# Patient Record
Sex: Female | Born: 1965
Health system: Southern US, Community
[De-identification: ages and names within clinical notes are randomized; demographics above are authoritative.]

## PROBLEM LIST (undated history)

## (undated) DIAGNOSIS — O99345 Other mental disorders complicating the puerperium: Secondary | ICD-10-CM

## (undated) DIAGNOSIS — H269 Unspecified cataract: Secondary | ICD-10-CM

## (undated) DIAGNOSIS — F32A Depression, unspecified: Secondary | ICD-10-CM

## (undated) DIAGNOSIS — T7840XA Allergy, unspecified, initial encounter: Secondary | ICD-10-CM

## (undated) DIAGNOSIS — F53 Postpartum depression: Secondary | ICD-10-CM

## (undated) DIAGNOSIS — K219 Gastro-esophageal reflux disease without esophagitis: Secondary | ICD-10-CM

## (undated) DIAGNOSIS — F419 Anxiety disorder, unspecified: Secondary | ICD-10-CM

## (undated) DIAGNOSIS — T148XXA Other injury of unspecified body region, initial encounter: Principal | ICD-10-CM

## (undated) DIAGNOSIS — R011 Cardiac murmur, unspecified: Secondary | ICD-10-CM

## (undated) DIAGNOSIS — F329 Major depressive disorder, single episode, unspecified: Secondary | ICD-10-CM

## (undated) HISTORY — DX: Allergy, unspecified, initial encounter: T78.40XA

## (undated) HISTORY — DX: Anxiety disorder, unspecified: F41.9

## (undated) HISTORY — DX: Gastro-esophageal reflux disease without esophagitis: K21.9

## (undated) HISTORY — PX: EYE SURGERY: SHX253

## (undated) HISTORY — DX: Cardiac murmur, unspecified: R01.1

## (undated) HISTORY — PX: TONSILLECTOMY: SUR1361

## (undated) HISTORY — DX: Postpartum depression: F53.0

## (undated) HISTORY — DX: Other injury of unspecified body region, initial encounter: T14.8XXA

## (undated) HISTORY — DX: Other mental disorders complicating the puerperium: O99.345

## (undated) HISTORY — DX: Major depressive disorder, single episode, unspecified: F32.9

## (undated) HISTORY — DX: Unspecified cataract: H26.9

## (undated) HISTORY — PX: APPENDECTOMY: SHX54

## (undated) HISTORY — DX: Depression, unspecified: F32.A

---

## 1998-07-04 ENCOUNTER — Encounter: Payer: Self-pay | Admitting: Emergency Medicine

## 1998-07-04 ENCOUNTER — Emergency Department (HOSPITAL_COMMUNITY): Admission: EM | Admit: 1998-07-04 | Discharge: 1998-07-04 | Payer: Self-pay | Admitting: Emergency Medicine

## 1999-03-12 ENCOUNTER — Other Ambulatory Visit: Admission: RE | Admit: 1999-03-12 | Discharge: 1999-03-12 | Payer: Self-pay | Admitting: Obstetrics & Gynecology

## 2000-04-07 ENCOUNTER — Other Ambulatory Visit: Admission: RE | Admit: 2000-04-07 | Discharge: 2000-04-07 | Payer: Self-pay | Admitting: Obstetrics & Gynecology

## 2001-09-06 ENCOUNTER — Encounter: Admission: RE | Admit: 2001-09-06 | Discharge: 2001-09-06 | Payer: Self-pay | Admitting: Otolaryngology

## 2001-09-06 ENCOUNTER — Encounter: Payer: Self-pay | Admitting: Otolaryngology

## 2001-10-28 ENCOUNTER — Ambulatory Visit (HOSPITAL_BASED_OUTPATIENT_CLINIC_OR_DEPARTMENT_OTHER): Admission: RE | Admit: 2001-10-28 | Discharge: 2001-10-28 | Payer: Self-pay | Admitting: Otolaryngology

## 2001-10-28 ENCOUNTER — Encounter (INDEPENDENT_AMBULATORY_CARE_PROVIDER_SITE_OTHER): Payer: Self-pay | Admitting: *Deleted

## 2002-06-17 ENCOUNTER — Encounter: Admission: RE | Admit: 2002-06-17 | Discharge: 2002-06-17 | Payer: Self-pay | Admitting: Obstetrics and Gynecology

## 2002-06-17 ENCOUNTER — Encounter: Payer: Self-pay | Admitting: Obstetrics and Gynecology

## 2002-09-28 ENCOUNTER — Other Ambulatory Visit: Admission: RE | Admit: 2002-09-28 | Discharge: 2002-09-28 | Payer: Self-pay | Admitting: Obstetrics and Gynecology

## 2003-09-12 ENCOUNTER — Encounter: Admission: RE | Admit: 2003-09-12 | Discharge: 2003-09-12 | Payer: Self-pay | Admitting: Obstetrics and Gynecology

## 2003-11-29 ENCOUNTER — Other Ambulatory Visit: Admission: RE | Admit: 2003-11-29 | Discharge: 2003-11-29 | Payer: Self-pay | Admitting: Obstetrics and Gynecology

## 2005-01-22 ENCOUNTER — Other Ambulatory Visit: Admission: RE | Admit: 2005-01-22 | Discharge: 2005-01-22 | Payer: Self-pay | Admitting: Obstetrics and Gynecology

## 2005-02-18 ENCOUNTER — Emergency Department (HOSPITAL_COMMUNITY): Admission: EM | Admit: 2005-02-18 | Discharge: 2005-02-18 | Payer: Self-pay | Admitting: Emergency Medicine

## 2006-09-28 ENCOUNTER — Ambulatory Visit (HOSPITAL_COMMUNITY): Admission: RE | Admit: 2006-09-28 | Discharge: 2006-09-28 | Payer: Self-pay | Admitting: Obstetrics and Gynecology

## 2007-02-08 ENCOUNTER — Encounter: Admission: RE | Admit: 2007-02-08 | Discharge: 2007-02-08 | Payer: Self-pay | Admitting: Obstetrics and Gynecology

## 2009-11-12 ENCOUNTER — Emergency Department (HOSPITAL_COMMUNITY): Admission: EM | Admit: 2009-11-12 | Discharge: 2009-11-13 | Payer: Self-pay | Admitting: Emergency Medicine

## 2010-09-16 LAB — CBC
HCT: 40.7 % (ref 36.0–46.0)
Hemoglobin: 13.9 g/dL (ref 12.0–15.0)
MCHC: 34.2 g/dL (ref 30.0–36.0)
Platelets: 215 10*3/uL (ref 150–400)
RDW: 13.1 % (ref 11.5–15.5)

## 2010-09-16 LAB — BASIC METABOLIC PANEL
BUN: 9 mg/dL (ref 6–23)
CO2: 27 mEq/L (ref 19–32)
Glucose, Bld: 93 mg/dL (ref 70–99)
Potassium: 3.6 mEq/L (ref 3.5–5.1)
Sodium: 140 mEq/L (ref 135–145)

## 2010-09-16 LAB — DIFFERENTIAL
Basophils Absolute: 0 10*3/uL (ref 0.0–0.1)
Basophils Relative: 0 % (ref 0–1)
Eosinophils Absolute: 0.2 10*3/uL (ref 0.0–0.7)
Eosinophils Relative: 3 % (ref 0–5)
Monocytes Absolute: 0.8 10*3/uL (ref 0.1–1.0)

## 2010-09-16 LAB — HEPATIC FUNCTION PANEL
ALT: 15 U/L (ref 0–35)
AST: 21 U/L (ref 0–37)
Alkaline Phosphatase: 47 U/L (ref 39–117)
Bilirubin, Direct: <0.1 mg/dL (ref 0.0–0.3)
Total Bilirubin: 0.5 mg/dL (ref 0.3–1.2)

## 2010-11-15 NOTE — Op Note (Signed)
[name], Carol Martinez                ACCOUNT NO.:  [id number]   MEDICAL RECORD NO.:  [id number]          PATIENT TYPE:  AMB   LOCATION:  SDC                           FACILITY:  [facility]   PHYSICIAN:  Dineen Kid. Rana Snare, M.D.    DATE OF BIRTH:  1965/08/21   DATE OF PROCEDURE:  09/28/2006  DATE OF DISCHARGE:                               OPERATIVE REPORT   PREOPERATIVE DIAGNOSES:  1. Cervical stenosis.  2. Anxiety.  3. Desires intrauterine device insertion.   POSTOPERATIVE DIAGNOSES:  1. Cervical stenosis.  2. Anxiety.  3. Desires intrauterine device insertion.   PROCEDURES:  1. Dilation of cervix.  2. Mirena intrauterine device insertion.   SURGEON:  Dineen Kid. Rana Snare, M.D.   ANESTHESIA:  General by LMA.   INDICATIONS:  [name] is a 45 year old with a previous Mirena IUD and  an anxiety disorder.  She had reached the end of her previous IUD's  expected life, had it easily removed in the office, but because of  severe anxiety was unable to relax or allow for insertion of the new  IUD.  She does have amenorrhea due to the previous IUD and has developed  a cervical stenosis to point that it would require a small amount of  dilation to insert IUD.  Because of the above problems, the patient  desires insertion under anesthesia in the operating room.  The risks and  procedure of the procedure were discussed at length, including the  Mirena IUD's reported success rates and failure rates and complication  rates.  She does give her informed consent and wished to proceed.   DESCRIPTION OF PROCEDURE:  After adequate analgesia, the patient was  placed in the dorsal lithotomy position.  She was sterilely prepped and  draped, the bladder was sterilely drained.  A Graves speculum was  placed.  A tenaculum was placed on the anterior lip of the cervix.  A  paracervical block was placed of 1% Xylocaine with 1:100,000  epinephrine, a total of 20 mL used.  The uterus was sounded to 8 cm from  a  retroverted position.  It was dilated to a #17 Pratt dilator.  The  Mirena IUD was then inserted according to the manufacturer's  recommendations, the string cut to 1 inch in length, the tenaculum  removed and rendered hemostatic.  The speculum was removed.  The patient  transferred to the recovery room in stable condition.  Sponge and  instrument count was normal x3.  Estimated blood loss was minimal.   DISPOSITION:  The patient will be discharged home with follow-up in the  office in 6 weeks.  Told to return for increased pain, fever or  bleeding.      Dineen Kid Rana Snare, M.D.  Electronically Signed     DCL/MEDQ  D:  09/28/2006  T:  09/28/2006  Job:  [id number]

## 2010-11-15 NOTE — Op Note (Signed)
[place]  Patient:    Carol Martinez, Carol Martinez Visit Number: [id number] MRN: [id number]          Service Type: DSU Location: [facility] Attending Physician:  Corie Chiquito Dictated by:   Margit Banda. Jearld Fenton, M.D. Proc. Date: 10/28/01 Admit Date:  10/28/2001 Discharge Date: 10/28/2001   CC:         [facility] [address]   Operative Report  PREOPERATIVE DIAGNOSIS:  Chronic sinusitis.  POSTOPERATIVE DIAGNOSIS:  Chronic sinusitis.  PROCEDURES: 1. Bilateral maxillary antrostomy. 2. Bilateral ethmoidectomy. 3. Right sphenoidotomy. 4. InstaTrak CT scan guidance.  ANESTHESIA:  General endotracheal tube.  ESTIMATED BLOOD LOSS:  Approximately 80-100 cc.  INDICATION:  This is a 45 year old who has had a long history of chronic nasal congestion and obstruction.  She has been treated medically, which has failed to resolve her problem and infections.  She has ear problems as well.  She has CT scan evidence of sinusitis with the left side being much worse than the right.  The patient was informed of the risks and benefits of the procedure, including bleeding, infection, CSF leak, change in the sense of smell, blindness, scarring of the sinuses, chronic crusting and drying, and risk of the anesthetic.  All questions are answered, and consent was obtained.  DESCRIPTION OF PROCEDURE:  Patient taken to the operating room and placed in the supine position.  After adequate general endotracheal tube anesthesia, was placed in the supine position and prepped and draped in the usual sterile manner.  The InstaTrak helmet was positioned and calibrated.  The nose was injected with 1% lidocaine with 1:100,000 epinephrine in the inferior turbinate, middle turbinate.  The left side was begun, making an uncinectomy with the microdebrider.  This was carried up to the attachment of the middle turbinate.  There was a lot of thickened mucosa throughout the infundibulum region.   The antrostomy was then opened and the maxillary antrum was recessed slightly laterally so the opening was a little more difficult to get to with the instruments, but it opened up nicely.  There was some thickened mucosa of the maxillary sinus.  The bulla was then opened, and the dissection was carried from posterior to anterior using InstaTrak guidance both for the antrostomy as well as the ethmoidectomy.  This was thickened mucosa in the anterior portion of the ethmoid cavity.  The patient did not have any significant frontal sinus area.  The pledgets soaked in oxymetazoline were then placed in the sinus and the right side was performed in the same fashion. Uncinate was removed with a microdebrider.  Very thickened mucosa in the entire aspect of the ethmoid cavity on this side. InstaTrak guidance was used. The sphenoid sinus was opened with InstaTrak guidance and this opened up very nicely.  There was a polyp in the sphenoid sinus on InstaTrak CT scan.  This was left intact.  The antrostomy was opened widely, and there was a slight amount of thickened mucosa of the maxillary sinus.  Oxymetazoline pledgets were placed into the nose of this sinus.  Both pledgets were then removed and a Kennedy pack was placed in both ethmoid cavities with bacitracin soaked and then injected with saline.  The nasopharynx was suctioned out of all blood and debris.  The oral cavity, oropharynx was suctioned out of all blood and debris under direct visualization.  The Kennedy pack strings were loosely tied across the columella.  The patient was awakened and brought to  recovery in stable condition, counts correct. Dictated by:   Margit Banda. Jearld Fenton, M.D. Attending Physician:  Corie Chiquito DD:  10/28/01 TD:  10/29/01 Job: [id number]

## 2012-12-22 ENCOUNTER — Ambulatory Visit (INDEPENDENT_AMBULATORY_CARE_PROVIDER_SITE_OTHER): Payer: PRIVATE HEALTH INSURANCE | Admitting: Emergency Medicine

## 2012-12-22 VITALS — BP 127/78 | HR 66 | Temp 98.3°F | Resp 16 | Ht 62.25 in | Wt 125.2 lb

## 2012-12-22 DIAGNOSIS — R079 Chest pain, unspecified: Secondary | ICD-10-CM

## 2012-12-22 DIAGNOSIS — K219 Gastro-esophageal reflux disease without esophagitis: Secondary | ICD-10-CM

## 2012-12-22 LAB — GLUCOSE, POCT (MANUAL RESULT ENTRY): POC Glucose: 91 mg/dl (ref 70–99)

## 2012-12-22 MED ORDER — ESOMEPRAZOLE MAGNESIUM 40 MG PO CPDR
40.0000 mg | DELAYED_RELEASE_CAPSULE | Freq: Every day | ORAL | Status: DC
Start: 2012-12-22 — End: 2013-09-08

## 2012-12-22 MED ORDER — SUCRALFATE 1 G PO TABS: 1.0000 g | ORAL_TABLET | Freq: Four times a day (QID) | ORAL | Status: DC

## 2012-12-22 NOTE — Progress Notes (Signed)
[facility] [address], [place] [address] [phone]  Date:  12/22/2012   Name:  Carol Martinez   DOB:  03-28-1966   MRN:  [id number]  PCP:  No primary provider on file.    Chief Complaint: Chest Pain, Headache, Epistaxis and Nausea   History of Present Illness:  Carol Martinez is a 47 y.o. very pleasant female patient who presents with the following:   Found today to have cataracts.  Doesn't have history of diabetes or radiation or steroid exposure.  Describes intermittent chest pain that she says is sharp in nature.  Says often comes on at night.  Radiates into left arm.  Lasts 10 minutes and recedes spontaneously.  No shortness of breath.  No nausea or vomiting.  No cough or wheezing.  Has water brash at times and frequent heartburn.  Reformed smoker.  No meds. No high blood pressure.  No improvement with over the counter medications or other home remedies. Denies other complaint or health concern today.   There are no active problems to display for this patient.   Past Medical History  Diagnosis Date  . Depression   . Allergy   . Heart murmur     Past Surgical History  Procedure Laterality Date  . Appendectomy      History  Substance Use Topics  . Smoking status: Never Smoker   . Smokeless tobacco: Not on file  . Alcohol Use: 4.2 oz/week    7 Glasses of wine per week    No family history on file.  Allergies  Allergen Reactions  . Phenergan (Promethazine Hcl) Anaphylaxis  . Aspirin Itching    Medication list has been reviewed and updated.  No current outpatient prescriptions on file prior to visit.   No current facility-administered medications on file prior to visit.    Review of Systems:  As per HPI, otherwise negative.    Physical Examination: Filed Vitals:   12/22/12 1715  BP: 127/78  Pulse: 66  Temp: 98.3 F (36.8 C)  Resp: 16   Filed Vitals:   12/22/12 1715  Height: 5' 2.25" (1.581 m)  Weight: 125 lb 3.2  oz (56.79 kg)   Body mass index is 22.72 kg/(m^2). Ideal Body Weight: Weight in (lb) to have BMI = 25: 137.5  GEN: WDWN, NAD, Non-toxic, A & O x 3 HEENT: Atraumatic, Normocephalic. Neck supple. No masses, No LAD. Ears and Nose: No external deformity. CV: RRR, No M/G/R. No JVD. No thrill. No extra heart sounds. PULM: CTA B, no wheezes, crackles, rhonchi. No retractions. No resp. distress. No accessory muscle use. ABD: S, NT, ND, +BS. No rebound. No HSM. EXTR: No c/c/e NEURO Normal gait.  PSYCH: Normally interactive. Conversant. Not depressed or anxious appearing.  Calm demeanor.    Assessment and Plan: Cataracts GERD Follow up as needed   Signed,  Phillips Odor, MD   Results for orders placed in visit on 12/22/12  POCT GLYCOSYLATED HEMOGLOBIN (HGB A1C)      Result Value Range   Hemoglobin A1C 4.8    GLUCOSE, POCT (MANUAL RESULT ENTRY)      Result Value Range   POC Glucose 91  70 - 99 mg/dl

## 2012-12-22 NOTE — Patient Instructions (Addendum)
Gastroesophageal Reflux Disease, Adult  Gastroesophageal reflux disease (GERD) happens when acid from your stomach flows up into the esophagus. When acid comes in contact with the esophagus, the acid causes soreness (inflammation) in the esophagus. Over time, GERD may create small holes (ulcers) in the lining of the esophagus.  CAUSES   · Increased body weight. This puts pressure on the stomach, making acid rise from the stomach into the esophagus.  · Smoking. This increases acid production in the stomach.  · Drinking alcohol. This causes decreased pressure in the lower esophageal sphincter (valve or ring of muscle between the esophagus and stomach), allowing acid from the stomach into the esophagus.  · Late evening meals and a full stomach. This increases pressure and acid production in the stomach.  · A malformed lower esophageal sphincter.  Sometimes, no cause is found.  SYMPTOMS   · Burning pain in the lower part of the mid-chest behind the breastbone and in the mid-stomach area. This may occur twice a week or more often.  · Trouble swallowing.  · Sore throat.  · Dry cough.  · Asthma-like symptoms including chest tightness, shortness of breath, or wheezing.  DIAGNOSIS   Your caregiver may be able to diagnose GERD based on your symptoms. In some cases, X-rays and other tests may be done to check for complications or to check the condition of your stomach and esophagus.  TREATMENT   Your caregiver may recommend over-the-counter or prescription medicines to help decrease acid production. Ask your caregiver before starting or adding any new medicines.   HOME CARE INSTRUCTIONS   · Change the factors that you can control. Ask your caregiver for guidance concerning weight loss, quitting smoking, and alcohol consumption.  · Avoid foods and drinks that make your symptoms worse, such as:  · Caffeine or alcoholic drinks.  · Chocolate.  · Peppermint or mint flavorings.  · Garlic and onions.  · Spicy foods.  · Citrus fruits,  such as oranges, lemons, or limes.  · Tomato-based foods such as sauce, chili, salsa, and pizza.  · Fried and fatty foods.  · Avoid lying down for the 3 hours prior to your bedtime or prior to taking a nap.  · Eat small, frequent meals instead of large meals.  · Wear loose-fitting clothing. Do not wear anything tight around your waist that causes pressure on your stomach.  · Raise the head of your bed 6 to 8 inches with wood blocks to help you sleep. Extra pillows will not help.  · Only take over-the-counter or prescription medicines for pain, discomfort, or fever as directed by your caregiver.  · Do not take aspirin, ibuprofen, or other nonsteroidal anti-inflammatory drugs (NSAIDs).  SEEK IMMEDIATE MEDICAL CARE IF:   · You have pain in your arms, neck, jaw, teeth, or back.  · Your pain increases or changes in intensity or duration.  · You develop nausea, vomiting, or sweating (diaphoresis).  · You develop shortness of breath, or you faint.  · Your vomit is green, yellow, black, or looks like coffee grounds or blood.  · Your stool is red, bloody, or black.  These symptoms could be signs of other problems, such as heart disease, gastric bleeding, or esophageal bleeding.  MAKE SURE YOU:   · Understand these instructions.  · Will watch your condition.  · Will get help right away if you are not doing well or get worse.  Document Released: 03/26/2005 Document Revised: 09/08/2011 Document Reviewed: 01/03/2011  [employer]® Patient   Information ©2014 [employer].

## 2012-12-23 LAB — COMPREHENSIVE METABOLIC PANEL
ALT: 10 U/L (ref 0–35)
CO2: 26 mEq/L (ref 19–32)
Calcium: 9.6 mg/dL (ref 8.4–10.5)
Chloride: 104 mEq/L (ref 96–112)
Creat: 0.72 mg/dL (ref 0.50–1.10)
Glucose, Bld: 88 mg/dL (ref 70–99)

## 2012-12-23 LAB — TSH: TSH: 0.918 u[IU]/mL (ref 0.350–4.500)

## 2013-01-11 ENCOUNTER — Other Ambulatory Visit: Payer: Self-pay | Admitting: Obstetrics and Gynecology

## 2013-01-11 DIAGNOSIS — N644 Mastodynia: Secondary | ICD-10-CM

## 2013-01-19 ENCOUNTER — Ambulatory Visit
Admission: RE | Admit: 2013-01-19 | Discharge: 2013-01-19 | Disposition: A | Discharge status: Home / Self Care | Payer: PRIVATE HEALTH INSURANCE | Source: Ambulatory Visit | Attending: Obstetrics and Gynecology | Admitting: Obstetrics and Gynecology

## 2013-01-19 ENCOUNTER — Other Ambulatory Visit: Payer: Self-pay | Admitting: Obstetrics and Gynecology

## 2013-01-19 DIAGNOSIS — N644 Mastodynia: Secondary | ICD-10-CM

## 2013-08-24 ENCOUNTER — Telehealth: Payer: Self-pay | Admitting: Hematology and Oncology

## 2013-08-24 NOTE — Telephone Encounter (Signed)
PATIENT CALLED TO SCHEDULE NEW PATIENT APPT FOR 03/12 @ 2:15 W/DR. GORSUCH.  REFERRING DR. Onalee HuaAVID, LOWE DX- EASY BRUISING WELCOME PACKET MAILED.

## 2013-08-24 NOTE — Telephone Encounter (Signed)
C/D 08/24/13 for appt. 09/08/13

## 2013-09-08 ENCOUNTER — Encounter: Payer: Self-pay | Admitting: Hematology and Oncology

## 2013-09-08 ENCOUNTER — Ambulatory Visit (HOSPITAL_BASED_OUTPATIENT_CLINIC_OR_DEPARTMENT_OTHER): Payer: PRIVATE HEALTH INSURANCE | Admitting: Hematology and Oncology

## 2013-09-08 ENCOUNTER — Ambulatory Visit (HOSPITAL_BASED_OUTPATIENT_CLINIC_OR_DEPARTMENT_OTHER): Payer: PRIVATE HEALTH INSURANCE

## 2013-09-08 ENCOUNTER — Telehealth: Payer: Self-pay | Admitting: Hematology and Oncology

## 2013-09-08 ENCOUNTER — Encounter (INDEPENDENT_AMBULATORY_CARE_PROVIDER_SITE_OTHER): Payer: Self-pay

## 2013-09-08 VITALS — BP 112/63 | HR 75 | Temp 98.4°F | Resp 18 | Wt 121.4 lb

## 2013-09-08 DIAGNOSIS — T148XXA Other injury of unspecified body region, initial encounter: Secondary | ICD-10-CM

## 2013-09-08 DIAGNOSIS — K056 Periodontal disease, unspecified: Secondary | ICD-10-CM

## 2013-09-08 DIAGNOSIS — R040 Epistaxis: Secondary | ICD-10-CM

## 2013-09-08 DIAGNOSIS — K069 Disorder of gingiva and edentulous alveolar ridge, unspecified: Secondary | ICD-10-CM

## 2013-09-08 HISTORY — DX: Other injury of unspecified body region, initial encounter: T14.8XXA

## 2013-09-08 LAB — CBC WITH DIFFERENTIAL/PLATELET
BASO%: 0.2 % (ref 0.0–2.0)
Basophils Absolute: 0 10*3/uL (ref 0.0–0.1)
EOS ABS: 0.1 10*3/uL (ref 0.0–0.5)
EOS%: 0.8 % (ref 0.0–7.0)
HEMATOCRIT: 40.7 % (ref 34.8–46.6)
HGB: 13.4 g/dL (ref 11.6–15.9)
LYMPH%: 19.5 % (ref 14.0–49.7)
MCH: 28.8 pg (ref 25.1–34.0)
MCHC: 33 g/dL (ref 31.5–36.0)
MCV: 87.5 fL (ref 79.5–101.0)
MONO#: 0.6 10*3/uL (ref 0.1–0.9)
MONO%: 6.5 % (ref 0.0–14.0)
NEUT%: 73 % (ref 38.4–76.8)
NEUTROS ABS: 7.2 10*3/uL — AB (ref 1.5–6.5)
PLATELETS: 253 10*3/uL (ref 145–400)
RBC: 4.65 10*6/uL (ref 3.70–5.45)
RDW: 13.3 % (ref 11.2–14.5)
WBC: 9.8 10*3/uL (ref 3.9–10.3)
lymph#: 1.9 10*3/uL (ref 0.9–3.3)

## 2013-09-08 LAB — COMPREHENSIVE METABOLIC PANEL (CC13)
ALBUMIN: 4.2 g/dL (ref 3.5–5.0)
ALK PHOS: 53 U/L (ref 40–150)
ALT: 20 U/L (ref 0–55)
AST: 20 U/L (ref 5–34)
Anion Gap: 10 mEq/L (ref 3–11)
BUN: 10.5 mg/dL (ref 7.0–26.0)
CALCIUM: 9.6 mg/dL (ref 8.4–10.4)
CHLORIDE: 103 meq/L (ref 98–109)
CO2: 25 mEq/L (ref 22–29)
Creatinine: 0.8 mg/dL (ref 0.6–1.1)
Glucose: 87 mg/dl (ref 70–140)
POTASSIUM: 3.7 meq/L (ref 3.5–5.1)
SODIUM: 139 meq/L (ref 136–145)
TOTAL PROTEIN: 6.9 g/dL (ref 6.4–8.3)
Total Bilirubin: 0.28 mg/dL (ref 0.20–1.20)

## 2013-09-08 LAB — MORPHOLOGY: PLT EST: ADEQUATE

## 2013-09-08 LAB — APTT: APTT: 29.2 s (ref 24–37)

## 2013-09-08 LAB — PROTHROMBIN TIME
INR: 0.96 (ref <1.50)
Prothrombin Time: 12.6 seconds (ref 11.6–15.2)

## 2013-09-08 NOTE — Telephone Encounter (Signed)
Gave pt appt for Md visit next week, sent pt to labs 2015

## 2013-09-08 NOTE — Progress Notes (Signed)
[facility] CONSULT NOTE  Patient Care Team: Turner Danielsavid C Lowe, MD as PCP - General (Obstetrics and Gynecology)  CHIEF COMPLAINTS/PURPOSE OF CONSULTATION:  Easy bruising  HISTORY OF PRESENTING ILLNESS:  Carol Martinez 48 y.o. female is here because of easy bruising.  She complained of easy bruising since last year. This past winter, she has intermittent nosebleeds coming from both nasal passages for a few times, the last one was about a month ago. The bruising is noted in the neck, her leg and her arm. She also has occasional gum bleeding when she brushed her teeth. She denies any menorrhagia from her menstrual cycles. The patient denies history of liver disease, exposure to heparin, history of cardiac murmur/prior cardiovascular surgery or recent new medications She denies prior blood or platelet transfusions   MEDICAL HISTORY:  Past Medical History  Diagnosis Date  . Depression   . Allergy   . Heart murmur   . Bruising 09/08/2013    SURGICAL HISTORY: Past Surgical History  Procedure Laterality Date  . Appendectomy      SOCIAL HISTORY: History   Social History  . Marital Status: Married    Spouse Name: N/A    Number of Children: N/A  . Years of Education: N/A   Occupational History  . Not on file.   Social History Main Topics  . Smoking status: Never Smoker   . Smokeless tobacco: Never Used  . Alcohol Use: 4.2 oz/week    7 Glasses of wine per week  . Drug Use: No  . Sexual Activity: Not on file   Other Topics Concern  . Not on file   Social History Narrative  . No narrative on file    FAMILY HISTORY: Family History  Problem Relation Age of Onset  . Cancer Maternal Grandmother     Blood problem    ALLERGIES:  is allergic to phenergan and aspirin.  MEDICATIONS:  No current outpatient prescriptions on file.   No current facility-administered medications for this visit.    REVIEW OF SYSTEMS:   Constitutional: Denies fevers, chills or  abnormal night sweats Eyes: Denies blurriness of vision, double vision or watery eyes Ears, nose, mouth, throat, and face: Denies mucositis or sore throat Respiratory: Denies cough, dyspnea or wheezes Cardiovascular: Denies palpitation, chest discomfort or lower extremity swelling Gastrointestinal:  Denies nausea, heartburn or change in bowel habits Skin: Denies abnormal skin rashes Lymphatics: Denies new lymphadenopathy  Neurological:Denies numbness, tingling or new weaknesses Behavioral/Psych: Mood is stable, no new changes  All other systems were reviewed with the patient and are negative.  PHYSICAL EXAMINATION: ECOG PERFORMANCE STATUS: 0 - Asymptomatic Filed Vitals:   09/08/13 1415  Weight: 121 lb 6.4 oz (55.067 kg)   Filed Vitals:   09/08/13 1415  BP: 112/63  Pulse: 75  Temp: 98.4 F (36.9 C)  Resp: 18    GENERAL:alert, no distress and comfortable SKIN: skin color, texture, turgor are normal, no rashes or significant lesions EYES: normal, conjunctiva are pink and non-injected, sclera clear OROPHARYNX:no exudate, no erythema and lips, buccal mucosa, and tongue normal  NECK: supple, thyroid normal size, non-tender, without nodularity LYMPH:  no palpable lymphadenopathy in the cervical, axillary or inguinal LUNGS: clear to auscultation and percussion with normal breathing effort HEART: regular rate & rhythm and no murmurs and no lower extremity edema ABDOMEN:abdomen soft, non-tender and normal bowel sounds Musculoskeletal:no cyanosis of digits and no clubbing  PSYCH: alert & oriented x 3 with fluent speech NEURO: no focal  motor/sensory deficits  LABORATORY DATA:  I have reviewed the data as listed Recent Results (from the past 2160 hour(s))  CBC WITH DIFFERENTIAL     Status: Abnormal   Collection Time    09/08/13  2:46 PM      Result Value Ref Range   WBC 9.8  3.9 - 10.3 10e3/uL   NEUT# 7.2 (*) 1.5 - 6.5 10e3/uL   HGB 13.4  11.6 - 15.9 g/dL   HCT 16.1  09.6 - 04.5  %   Platelets 253  145 - 400 10e3/uL   MCV 87.5  79.5 - 101.0 fL   MCH 28.8  25.1 - 34.0 pg   MCHC 33.0  31.5 - 36.0 g/dL   RBC 4.09  8.11 - 9.14 10e6/uL   RDW 13.3  11.2 - 14.5 %   lymph# 1.9  0.9 - 3.3 10e3/uL   MONO# 0.6  0.1 - 0.9 10e3/uL   Eosinophils Absolute 0.1  0.0 - 0.5 10e3/uL   Basophils Absolute 0.0  0.0 - 0.1 10e3/uL   NEUT% 73.0  38.4 - 76.8 %   LYMPH% 19.5  14.0 - 49.7 %   MONO% 6.5  0.0 - 14.0 %   EOS% 0.8  0.0 - 7.0 %   BASO% 0.2  0.0 - 2.0 %  MORPHOLOGY     Status: None   Collection Time    09/08/13  2:46 PM      Result Value Ref Range   Polychromasia Slight  Slight   Ovalocytes Few  Negative   White Cell Comments Variant Lymphs     PLT EST Adequate  Adequate   Platelet Morphology Large and giant platelets  Within Normal Limits  APTT     Status: None   Collection Time    09/08/13  2:48 PM      Result Value Ref Range   aPTT 29.2  24 - 37 seconds   Comment: This test is for screening purposes only; it should not be used fortherapeutic unfractionated heparin monitoring.  Please refer toHeparin Anti-Xa (78295).  PROTHROMBIN TIME     Status: None   Collection Time    09/08/13  2:48 PM      Result Value Ref Range   Prothrombin Time 12.6  11.6 - 15.2 seconds   INR 0.96  <1.50   Comment: The INR is of principal utility in following patients on stable dosesof oral anticoagulants.  The therapeutic range is generally 2.0 to3.0, but may be 3.0 to 4.0 in patients with mechanical cardiac valves,recurrent embolisms and antiphospholipid      antibodies (including lupusinhibitors).  COMPREHENSIVE METABOLIC PANEL (CC13)     Status: None   Collection Time    09/08/13  2:48 PM      Result Value Ref Range   Sodium 139  136 - 145 mEq/L   Potassium 3.7  3.5 - 5.1 mEq/L   Chloride 103  98 - 109 mEq/L   CO2 25  22 - 29 mEq/L   Glucose 87  70 - 140 mg/dl   BUN 62.1  7.0 - 30.8 mg/dL   Creatinine 0.8  0.6 - 1.1 mg/dL   Total Bilirubin 6.57  0.20 - 1.20 mg/dL   Alkaline  Phosphatase 53  40 - 150 U/L   AST 20  5 - 34 U/L   ALT 20  0 - 55 U/L   Total Protein 6.9  6.4 - 8.3 g/dL   Albumin 4.2  3.5 - 5.0 g/dL  Calcium 9.6  8.4 - 10.4 mg/dL   Anion Gap 10  3 - 11 mEq/L   ASSESSMENT & PLAN:  #1 Easy bruising Cause is unknown. I have order multiple testings that came back within normal limits. I recommend observation only. At the time of dictation, the patient has left the clinic. If the patient is interested for further evaluation, I will order some additional testing to rule out mild von Willebrand's disease. I think the test will probably come back normal as the patient had multiple surgeries in the past without bleeding complications.

## 2013-09-08 NOTE — Progress Notes (Signed)
Checked in new patient with no financial issues. She has appt card and may come back to pay her copay today.

## 2013-09-09 ENCOUNTER — Telehealth: Payer: Self-pay | Admitting: Hematology and Oncology

## 2013-09-09 NOTE — Telephone Encounter (Signed)
I spoke with the patient what the phone. I reviewed all the screening test for bruising. She has a completely normal CBC and metabolic panel. Coagulation panel including aPTT, pro time and INR were all normal. I told the patient I cannot explain the cause of her bruising. If the patient wants further testing, I certainly can bring her back to run a von Willebrand's panel or platelet aggregation testing. At present time, the patient is comfortable not to pursue additional testing. If she has recurrence of bleeding or bruising, she will call me back and I will order those tests as discussed. I will cancel her appointment next week.

## 2013-09-13 ENCOUNTER — Ambulatory Visit: Payer: PRIVATE HEALTH INSURANCE | Admitting: Hematology and Oncology

## 2013-09-13 ENCOUNTER — Other Ambulatory Visit: Payer: PRIVATE HEALTH INSURANCE

## 2014-07-27 ENCOUNTER — Other Ambulatory Visit: Payer: Self-pay | Admitting: Orthopaedic Surgery

## 2014-07-27 DIAGNOSIS — M41125 Adolescent idiopathic scoliosis, thoracolumbar region: Secondary | ICD-10-CM

## 2014-07-27 DIAGNOSIS — M47817 Spondylosis without myelopathy or radiculopathy, lumbosacral region: Secondary | ICD-10-CM

## 2014-08-10 ENCOUNTER — Other Ambulatory Visit: Payer: Self-pay | Admitting: Orthopaedic Surgery

## 2014-08-13 ENCOUNTER — Ambulatory Visit
Admission: RE | Admit: 2014-08-13 | Discharge: 2014-08-13 | Disposition: A | Discharge status: Home / Self Care | Payer: BLUE CROSS/BLUE SHIELD | Source: Ambulatory Visit | Attending: Orthopaedic Surgery | Admitting: Orthopaedic Surgery

## 2014-08-13 DIAGNOSIS — M47817 Spondylosis without myelopathy or radiculopathy, lumbosacral region: Secondary | ICD-10-CM

## 2014-08-13 DIAGNOSIS — M41125 Adolescent idiopathic scoliosis, thoracolumbar region: Secondary | ICD-10-CM

## 2014-11-02 ENCOUNTER — Other Ambulatory Visit (HOSPITAL_COMMUNITY): Payer: Self-pay | Admitting: Obstetrics and Gynecology

## 2014-11-03 ENCOUNTER — Other Ambulatory Visit: Payer: Self-pay | Admitting: Obstetrics and Gynecology

## 2014-11-03 DIAGNOSIS — N644 Mastodynia: Secondary | ICD-10-CM

## 2014-11-06 LAB — CYTOLOGY - PAP

## 2014-11-09 ENCOUNTER — Ambulatory Visit
Admission: RE | Admit: 2014-11-09 | Discharge: 2014-11-09 | Disposition: A | Discharge status: Home / Self Care | Payer: BLUE CROSS/BLUE SHIELD | Source: Ambulatory Visit | Attending: Obstetrics and Gynecology | Admitting: Obstetrics and Gynecology

## 2014-11-09 DIAGNOSIS — N644 Mastodynia: Secondary | ICD-10-CM

## 2015-01-11 ENCOUNTER — Ambulatory Visit (INDEPENDENT_AMBULATORY_CARE_PROVIDER_SITE_OTHER): Payer: BLUE CROSS/BLUE SHIELD

## 2015-01-11 ENCOUNTER — Ambulatory Visit (INDEPENDENT_AMBULATORY_CARE_PROVIDER_SITE_OTHER): Payer: BLUE CROSS/BLUE SHIELD | Admitting: Emergency Medicine

## 2015-01-11 VITALS — BP 108/76 | HR 70 | Temp 99.2°F | Resp 16 | Ht 62.0 in | Wt 137.0 lb

## 2015-01-11 DIAGNOSIS — K219 Gastro-esophageal reflux disease without esophagitis: Secondary | ICD-10-CM

## 2015-01-11 DIAGNOSIS — R1011 Right upper quadrant pain: Secondary | ICD-10-CM

## 2015-01-11 LAB — CBC WITH DIFFERENTIAL/PLATELET
BASOS PCT: 0 % (ref 0–1)
Basophils Absolute: 0 10*3/uL (ref 0.0–0.1)
EOS ABS: 0.1 10*3/uL (ref 0.0–0.7)
Eosinophils Relative: 1 % (ref 0–5)
HCT: 43.1 % (ref 36.0–46.0)
Hemoglobin: 14.6 g/dL (ref 12.0–15.0)
LYMPHS PCT: 26 % (ref 12–46)
Lymphs Abs: 1.9 10*3/uL (ref 0.7–4.0)
MCH: 28.8 pg (ref 26.0–34.0)
MCHC: 33.9 g/dL (ref 30.0–36.0)
MCV: 85 fL (ref 78.0–100.0)
MONOS PCT: 7 % (ref 3–12)
MPV: 11.4 fL (ref 8.6–12.4)
Monocytes Absolute: 0.5 10*3/uL (ref 0.1–1.0)
Neutro Abs: 4.8 10*3/uL (ref 1.7–7.7)
Neutrophils Relative %: 66 % (ref 43–77)
PLATELETS: 289 10*3/uL (ref 150–400)
RBC: 5.07 MIL/uL (ref 3.87–5.11)
RDW: 13.7 % (ref 11.5–15.5)
WBC: 7.2 10*3/uL (ref 4.0–10.5)

## 2015-01-11 LAB — AMYLASE: Amylase: 64 U/L (ref 0–105)

## 2015-01-11 LAB — COMPREHENSIVE METABOLIC PANEL
ALBUMIN: 4.6 g/dL (ref 3.5–5.2)
ALT: 13 U/L (ref 0–35)
AST: 15 U/L (ref 0–37)
Alkaline Phosphatase: 49 U/L (ref 39–117)
BILIRUBIN TOTAL: 0.5 mg/dL (ref 0.2–1.2)
BUN: 13 mg/dL (ref 6–23)
CO2: 24 mEq/L (ref 19–32)
Calcium: 9.7 mg/dL (ref 8.4–10.5)
Chloride: 104 mEq/L (ref 96–112)
Creat: 0.78 mg/dL (ref 0.50–1.10)
GLUCOSE: 79 mg/dL (ref 70–99)
Potassium: 3.9 mEq/L (ref 3.5–5.3)
SODIUM: 142 meq/L (ref 135–145)
TOTAL PROTEIN: 7.2 g/dL (ref 6.0–8.3)

## 2015-01-11 LAB — LIPASE: Lipase: 14 U/L (ref 0–75)

## 2015-01-11 MED ORDER — POLYETHYLENE GLYCOL 3350 17 GM/SCOOP PO POWD: 17.0000 g | Freq: Two times a day (BID) | ORAL | Status: DC | PRN

## 2015-01-11 MED ORDER — LANSOPRAZOLE 30 MG PO CPDR: 30.0000 mg | DELAYED_RELEASE_CAPSULE | Freq: Every day | ORAL | Status: DC

## 2015-01-11 MED ORDER — SUCRALFATE 1 G PO TABS: ORAL_TABLET | ORAL | Status: DC

## 2015-01-11 NOTE — Progress Notes (Signed)
Subjective:  Patient ID: Carol Martinez, female    DOB: Jun 16, 1966  Age: 49 y.o. MRN: [id number]  CC: Abdominal Pain and other   HPI Carol SaltsSylvie M Martinez presents  with epigastric and right upper quadrant pain. She said she been having this discomfort for at least 3 weeks. Rather constipated and has been treating herself with olive oil with no improvement. She's had no nausea vomiting no rectal bleeding. No vomiting blood. She describes having water brash and burning sensation in her chest. She also has right upper quadrant pain is increased with food. She hasn't drink caffeine or carbonated beverages to excess doesn't use excessive non-steroidal's or aspirin. She doesn't smoke. She drinks wine occasionally.   History Carol Martinez has a past medical history of Depression; Allergy; Heart murmur; Bruising (09/08/2013); Anxiety; and Cataract.   She has past surgical history that includes Appendectomy and Eye surgery.   Her  family history includes Cancer in her maternal grandmother.  She   reports that she has never smoked. She has never used smokeless tobacco. She reports that she drinks about 4.2 oz of alcohol per week. She reports that she does not use illicit drugs.  No outpatient prescriptions prior to visit.   No facility-administered medications prior to visit.    History   Social History  . Marital Status: Married    Spouse Name: N/A  . Number of Children: N/A  . Years of Education: N/A   Social History Main Topics  . Smoking status: Never Smoker   . Smokeless tobacco: Never Used  . Alcohol Use: 4.2 oz/week    7 Glasses of wine per week  . Drug Use: No  . Sexual Activity: Not on file   Other Topics Concern  . None   Social History Narrative     Review of Systems  Objective:  BP 108/76 mmHg  Pulse 70  Temp(Src) 99.2 F (37.3 C) (Oral)  Resp 16  Ht 5\' 2"  (1.575 m)  Wt 137 lb (62.143 kg)  BMI 25.05 kg/m2  SpO2 98%  Physical Exam    Assessment & Plan:    Carol Martinez was seen today for abdominal pain and other.  Diagnoses and all orders for this visit:  Abdominal pain, right upper quadrant Orders: -     DG Abd 1 View; Future -     CBC with Differential/Platelet -     Comprehensive metabolic panel -     Lipase -     Amylase -     US Abdomen Complete; Future  Gastroesophageal reflux disease, esophagitis presence not specified Orders: -     DG Abd 1 View; Future -     CBC with Differential/Platelet -     Comprehensive metabolic panel -     Lipase -     Amylase  Other orders -     polyethylene glycol powder (GLYCOLAX/MIRALAX) powder; Take 17 g by mouth 2 (two) times daily as needed. -     lansoprazole (PREVACID) 30 MG capsule; Take 1 capsule (30 mg total) by mouth daily at 12 noon. -     sucralfate (CARAFATE) 1 G tablet; 1 tablet 1 hr ac and hs   I am having Carol Martinez start on polyethylene glycol powder, lansoprazole, and sucralfate.  Meds ordered this encounter  Medications  . polyethylene glycol powder (GLYCOLAX/MIRALAX) powder    Sig: Take 17 g by mouth 2 (two) times daily as needed.    Dispense:  3350 g    Refill:  1  . lansoprazole (PREVACID) 30 MG capsule    Sig: Take 1 capsule (30 mg total) by mouth daily at 12 noon.    Dispense:  30 capsule    Refill:  5  . sucralfate (CARAFATE) 1 G tablet    Sig: 1 tablet 1 hr ac and hs    Dispense:  120 tablet    Refill:  0   Her symptoms certainly suggest gastroesophageal reflux disorder and possibly cholecystitis. After labs and ultrasound are comprehensive discuss it more whether and she put on Prevacid Carafate meanwhile.  Appropriate red flag conditions were discussed with the patient as well as actions that should be taken.  Patient expressed his understanding.  Follow-up: Return if symptoms worsen or fail to improve.  Carmelina Dane, MD   [facility] reading (PRIMARY) by  Dr. Dareen Piano.  Excess stool and gas.

## 2015-01-11 NOTE — Patient Instructions (Signed)
Constipation  Constipation is when a person has fewer than three bowel movements a week, has difficulty having a bowel movement, or has stools that are dry, hard, or larger than normal. As people grow older, constipation is more common. If you try to fix constipation with medicines that make you have a bowel movement (laxatives), the problem may get worse. Long-term laxative use may cause the muscles of the colon to become weak. A low-fiber diet, not taking in enough fluids, and taking certain medicines may make constipation worse.   CAUSES   · Certain medicines, such as antidepressants, pain medicine, iron supplements, antacids, and water pills.    · Certain diseases, such as diabetes, irritable bowel syndrome (IBS), thyroid disease, or depression.    · Not drinking enough water.    · Not eating enough fiber-rich foods.    · Stress or travel.    · Lack of physical activity or exercise.    · Ignoring the urge to have a bowel movement.    · Using laxatives too much.    SIGNS AND SYMPTOMS   · Having fewer than three bowel movements a week.    · Straining to have a bowel movement.    · Having stools that are hard, dry, or larger than normal.    · Feeling full or bloated.    · Pain in the lower abdomen.    · Not feeling relief after having a bowel movement.    DIAGNOSIS   Your health care provider will take a medical history and perform a physical exam. Further testing may be done for severe constipation. Some tests may include:  · A barium enema X-ray to examine your rectum, colon, and, sometimes, your small intestine.    · A sigmoidoscopy to examine your lower colon.    · A colonoscopy to examine your entire colon.  TREATMENT   Treatment will depend on the severity of your constipation and what is causing it. Some dietary treatments include drinking more fluids and eating more fiber-rich foods. Lifestyle treatments may include regular exercise. If these diet and lifestyle recommendations do not help, your health care  provider may recommend taking over-the-counter laxative medicines to help you have bowel movements. Prescription medicines may be prescribed if over-the-counter medicines do not work.   HOME CARE INSTRUCTIONS   · Eat foods that have a lot of fiber, such as fruits, vegetables, whole grains, and beans.  · Limit foods high in fat and processed sugars, such as french fries, hamburgers, cookies, candies, and soda.    · A fiber supplement may be added to your diet if you cannot get enough fiber from foods.    · Drink enough fluids to keep your urine clear or pale yellow.    · Exercise regularly or as directed by your health care provider.    · Go to the restroom when you have the urge to go. Do not hold it.    · Only take over-the-counter or prescription medicines as directed by your health care provider. Do not take other medicines for constipation without talking to your health care provider first.    SEEK IMMEDIATE MEDICAL CARE IF:   · You have bright red blood in your stool.    · Your constipation lasts for more than 4 days or gets worse.    · You have abdominal or rectal pain.    · You have thin, pencil-like stools.    · You have unexplained weight loss.  MAKE SURE YOU:   · Understand these instructions.  · Will watch your condition.  · Will get help right away if you are not   you have with your health care provider. Gastroesophageal Reflux Disease, Adult Gastroesophageal reflux disease (GERD) happens when acid from your stomach flows up into the esophagus. When acid comes in contact with the esophagus, the acid causes soreness (inflammation) in the esophagus. Over time, GERD may create small holes (ulcers) in the lining of  the esophagus. CAUSES   Increased body weight. This puts pressure on the stomach, making acid rise from the stomach into the esophagus.  Smoking. This increases acid production in the stomach.  Drinking alcohol. This causes decreased pressure in the lower esophageal sphincter (valve or ring of muscle between the esophagus and stomach), allowing acid from the stomach into the esophagus.  Late evening meals and a full stomach. This increases pressure and acid production in the stomach.  A malformed lower esophageal sphincter. Sometimes, no cause is found. SYMPTOMS   Burning pain in the lower part of the mid-chest behind the breastbone and in the mid-stomach area. This may occur twice a week or more often.  Trouble swallowing.  Sore throat.  Dry cough.  Asthma-like symptoms including chest tightness, shortness of breath, or wheezing. DIAGNOSIS  Your caregiver may be able to diagnose GERD based on your symptoms. In some cases, X-rays and other tests may be done to check for complications or to check the condition of your stomach and esophagus. TREATMENT  Your caregiver may recommend over-the-counter or prescription medicines to help decrease acid production. Ask your caregiver before starting or adding any new medicines.  HOME CARE INSTRUCTIONS   Change the factors that you can control. Ask your caregiver for guidance concerning weight loss, quitting smoking, and alcohol consumption.  Avoid foods and drinks that make your symptoms worse, such as:  Caffeine or alcoholic drinks.  Chocolate.  Peppermint or mint flavorings.  Garlic and onions.  Spicy foods.  Citrus fruits, such as oranges, lemons, or limes.  Tomato-based foods such as sauce, chili, salsa, and pizza.  Fried and fatty foods.  Avoid lying down for the 3 hours prior to your bedtime or prior to taking a nap.  Eat small, frequent meals instead of large meals.  Wear loose-fitting clothing. Do not wear anything  tight around your waist that causes pressure on your stomach.  Raise the head of your bed 6 to 8 inches with wood blocks to help you sleep. Extra pillows will not help.  Only take over-the-counter or prescription medicines for pain, discomfort, or fever as directed by your caregiver.  Do not take aspirin, ibuprofen, or other nonsteroidal anti-inflammatory drugs (NSAIDs). SEEK IMMEDIATE MEDICAL CARE IF:   You have pain in your arms, neck, jaw, teeth, or back.  Your pain increases or changes in intensity or duration.  You develop nausea, vomiting, or sweating (diaphoresis).  You develop shortness of breath, or you faint.  Your vomit is green, yellow, black, or looks like coffee grounds or blood.  Your stool is red, bloody, or black. These symptoms could be signs of other problems, such as heart disease, gastric bleeding, or esophageal bleeding. MAKE SURE YOU:   Understand these instructions.  Will watch your condition.  Will get help right away if you are not doing well or get worse. Document Released: 03/26/2005 Document Revised: 09/08/2011 Document Reviewed: 01/03/2011 [facility] Patient Information 2015 [employer] [place]. This information is not intended to replace advice given to you by your health care provider. Make sure you discuss any questions you have with your health care provider.

## 2015-01-12 ENCOUNTER — Telehealth: Payer: Self-pay

## 2015-01-12 ENCOUNTER — Telehealth: Payer: Self-pay | Admitting: Family Medicine

## 2015-01-12 NOTE — Telephone Encounter (Signed)
Calling about labs and medication pravacide

## 2015-01-12 NOTE — Telephone Encounter (Signed)
Labs are normal.  I don't understand your comment about prevacid.

## 2015-01-12 NOTE — Telephone Encounter (Signed)
Patient states that she needs PA for one of her medications. She couldn't name the medication she just kept saying "the third medication".   [phone]

## 2015-01-12 NOTE — Telephone Encounter (Signed)
Pt needs PA on Prevacid.

## 2015-01-15 ENCOUNTER — Other Ambulatory Visit: Payer: Self-pay | Admitting: Emergency Medicine

## 2015-01-15 ENCOUNTER — Telehealth: Payer: Self-pay | Admitting: Family Medicine

## 2015-01-15 MED ORDER — PANTOPRAZOLE SODIUM 40 MG PO TBEC: 40.0000 mg | DELAYED_RELEASE_TABLET | Freq: Every day | ORAL | Status: DC

## 2015-01-15 NOTE — Telephone Encounter (Signed)
Sent protonix

## 2015-01-15 NOTE — Telephone Encounter (Signed)
Dr Dareen PianoAnderson, I called to get PA on lansoprazole and was advised by ins that pt needs to have tried omeprazole and pantoprazole before they will cover lansoprazole. Either of these should be covered w/out PA. Do you want to Rx one of these?

## 2015-01-15 NOTE — Telephone Encounter (Signed)
lmom to call back pertaining her message about medicine

## 2015-01-16 NOTE — Telephone Encounter (Signed)
LMOM for pt that new Rx was sent for pantoprazole which should be covered by her ins.

## 2015-01-23 ENCOUNTER — Other Ambulatory Visit: Payer: Self-pay | Admitting: Emergency Medicine

## 2015-01-23 ENCOUNTER — Ambulatory Visit
Admission: RE | Admit: 2015-01-23 | Discharge: 2015-01-23 | Disposition: A | Discharge status: Home / Self Care | Payer: BLUE CROSS/BLUE SHIELD | Source: Ambulatory Visit | Attending: Emergency Medicine | Admitting: Emergency Medicine

## 2015-01-23 DIAGNOSIS — R1011 Right upper quadrant pain: Secondary | ICD-10-CM

## 2015-02-01 ENCOUNTER — Ambulatory Visit (HOSPITAL_COMMUNITY)
Admission: RE | Admit: 2015-02-01 | Discharge: 2015-02-01 | Disposition: A | Discharge status: Home / Self Care | Payer: BLUE CROSS/BLUE SHIELD | Source: Ambulatory Visit | Attending: Emergency Medicine | Admitting: Emergency Medicine

## 2015-02-01 DIAGNOSIS — R1011 Right upper quadrant pain: Secondary | ICD-10-CM | POA: Diagnosis present

## 2015-02-01 MED ORDER — SINCALIDE 5 MCG IJ SOLR
INTRAMUSCULAR | Status: AC
  Administered 2015-02-01: 1.3 ug via INTRAVENOUS
  Filled 2015-02-01: qty 5

## 2015-02-01 MED ORDER — TECHNETIUM TC 99M MEBROFENIN IV KIT
5.0000 | PACK | Freq: Once | INTRAVENOUS | Status: AC | PRN
  Administered 2015-02-01: 5 via INTRAVENOUS

## 2015-02-01 MED ORDER — STERILE WATER FOR INJECTION IJ SOLN
INTRAMUSCULAR | Status: AC
  Filled 2015-02-01: qty 10

## 2015-02-01 MED ORDER — SINCALIDE 5 MCG IJ SOLR
0.0200 ug/kg | Freq: Once | INTRAMUSCULAR | Status: AC
Start: 2015-02-01 — End: 2015-02-01
  Administered 2015-02-01: 1.3 ug via INTRAVENOUS

## 2015-02-01 NOTE — Progress Notes (Signed)
Pt brought to radiology in wheelchair by staff, Pt felt faint in elevator after nuc med Hida scan.  According to Nuc med staff pt felt weak after IV removal, po's given and d/c'd.  Pt reports history of fainting easily, has fainted with manual BP due to arm pain.  VS taken twice 140s/70s, HR 70s sat 99%.  Given po's and food.  Reports being NPO for 24 hours and feeling hungry and thirsty.  Pt requesting to go home, feeling much better after po's.  Wheelchair escort to car.  Encouraged to drink plenty of liquids and eat once home.  Daughter driving.  No c/o pain other than frequent headache NAD.

## 2015-02-02 ENCOUNTER — Telehealth: Payer: Self-pay

## 2015-02-02 NOTE — Telephone Encounter (Deleted)
Pt states that

## 2015-02-19 ENCOUNTER — Other Ambulatory Visit: Payer: Self-pay

## 2015-02-19 MED ORDER — SUCRALFATE 1 G PO TABS: ORAL_TABLET | ORAL | Status: DC

## 2015-02-19 MED ORDER — PANTOPRAZOLE SODIUM 40 MG PO TBEC: 40.0000 mg | DELAYED_RELEASE_TABLET | Freq: Every day | ORAL | Status: DC

## 2015-02-19 NOTE — Telephone Encounter (Signed)
Pharm reqs 90 day RF of sucralfate. Dr Dareen Piano, do you want to give RF or pt RTC?

## 2015-02-19 NOTE — Telephone Encounter (Signed)
Also RFs of pantoprazole?

## 2015-03-16 ENCOUNTER — Ambulatory Visit (INDEPENDENT_AMBULATORY_CARE_PROVIDER_SITE_OTHER): Payer: BLUE CROSS/BLUE SHIELD | Admitting: Family Medicine

## 2015-03-16 VITALS — BP 114/68 | HR 65 | Temp 98.5°F | Resp 18 | Ht 62.0 in | Wt 139.0 lb

## 2015-03-16 DIAGNOSIS — K5909 Other constipation: Secondary | ICD-10-CM

## 2015-03-16 DIAGNOSIS — R1011 Right upper quadrant pain: Secondary | ICD-10-CM | POA: Diagnosis not present

## 2015-03-16 LAB — TSH: TSH: 1.735 u[IU]/mL (ref 0.350–4.500)

## 2015-03-16 MED ORDER — LINACLOTIDE 145 MCG PO CAPS: 145.0000 ug | ORAL_CAPSULE | Freq: Every day | ORAL | Status: DC

## 2015-03-16 NOTE — Patient Instructions (Signed)

## 2015-03-16 NOTE — Progress Notes (Addendum)
Patient ID: Carol Martinez, female   DOB: 06/23/1966, 49 y.o.   MRN: [id number]  This chart was scribed for Carol Haber, MD by Ladene Artist, ED Scribe. The patient was seen in room 1. Patient's care was started at 3:37 PM.  Patient ID: Carol Martinez MRN: [id number], DOB: [birth year], 49 y.o. Date of Encounter: 03/16/2015, 3:37 PM  Primary Physician: Luz Lex, MD  Chief Complaint  Patient presents with   Abdominal Pain    rt side after every meal, pain has not got any better    Chills   Excessive Sweating   Nausea   Weight Gain   Fatigue   Depression    see screen     HPI: 49 y.o. year old female with history below presents with constant, gradually worsening right upper quadrant and central abdominal pain for the past 4 months. Pt describes pain as a sharp, burning and stabbing sensation that is exacerbated with eating. She reports associated nausea, chills, diaphoresis, fatigue, acid in her throat with lying down, abnormal stools, intermittent diarrhea, appetite change, weight gain which has caused low self-esteem. She reports that her normal weight is 106 lbs, today, she is 139 lbs. She further reports that she has noticed white spots in her stools that resembles worms. Pt denies traveling outside of the country and consuming tap water. She has had a XR, [place], MRI and GI nuclear scan without any significant findings, however, she reports that her XR revealed constipation in July. She has tried Miralax without significant. No sick contacts with similar symptoms. She denies emesis. Pt has not tried probiotics or been seen by a gastroenterologist for symptoms.  Pt works at [employer] at [employer]. Pt is married and from [place], [place]; she moved here 21 years ago.   Past Medical History  Diagnosis Date   Depression    Allergy    Heart murmur    Bruising 09/08/2013   Anxiety    Cataract      Home Meds: Prior to Admission medications   Medication Sig Start Date End Date  Taking? Authorizing Provider  pantoprazole (PROTONIX) 40 MG tablet Take 1 tablet (40 mg total) by mouth daily. 02/19/15  Yes Roselee Culver, MD  sucralfate (CARAFATE) 1 G tablet 1 tablet 1 hr ac and hs 02/19/15  Yes Roselee Culver, MD  lansoprazole (PREVACID) 30 MG capsule Take 1 capsule (30 mg total) by mouth daily at 12 noon. Patient not taking: Reported on 03/16/2015 01/11/15   Roselee Culver, MD  polyethylene glycol powder (GLYCOLAX/MIRALAX) powder Take 17 g by mouth 2 (two) times daily as needed. Patient not taking: Reported on 03/16/2015 01/11/15   Roselee Culver, MD    Allergies:  Allergies  Allergen Reactions   Phenergan [Promethazine Hcl] Anaphylaxis   Aspirin Itching   Penicillins Hives    Social History   Social History   Marital Status: Married    Spouse Name: N/A   Number of Children: N/A   Years of Education: N/A   Occupational History   Not on file.   Social History Main Topics   Smoking status: Never Smoker    Smokeless tobacco: Never Used   Alcohol Use: 4.2 oz/week    7 Glasses of wine per week   Drug Use: No   Sexual Activity: Not on file   Other Topics Concern   Not on file   Social History Narrative     Review of Systems: Constitutional: negative for fever, night sweats, +  chills, + fatigue, + diaphoresis, + weight changes, + appetite change HEENT: negative for vision changes, hearing loss, congestion, rhinorrhea, ST, epistaxis, or sinus pressure Cardiovascular: negative for chest pain or palpitations Respiratory: negative for hemoptysis, wheezing, shortness of breath, or cough Abdominal: negative for vomiting, + constipation, + abdominal pain, + nausea, + diarrhea, + abnormal stools Dermatological: negative for rash Neurologic: negative for headache, dizziness, or syncope All other systems reviewed and are otherwise negative with the exception to those above and in the HPI.  Physical Exam: Blood pressure 114/68, pulse  65, temperature 98.5 F (36.9 C), temperature source Oral, resp. rate 18, height 5' 2"  (1.575 m), weight 139 lb (63.05 kg), SpO2 97 %., Body mass index is 25.42 kg/(m^2). General: Well developed, well nourished, in no acute distress. Head: Normocephalic, atraumatic, eyes without discharge, sclera non-icteric, nares are without discharge. Bilateral auditory canals clear, TM's are without perforation, pearly grey and translucent with reflective cone of light bilaterally. Oral cavity moist, posterior pharynx without exudate, erythema, peritonsillar abscess, or post nasal drip.  Neck: Supple. No thyromegaly. Full ROM. No lymphadenopathy. Lungs: Clear bilaterally to auscultation without wheezes, rales, or rhonchi. Breathing is unlabored. Heart: RRR with S1 S2. No murmurs, rubs, or gallops appreciated. Abdomen: Non-distended with hyperactive bowel sounds. No hepatomegaly. No rebound/guarding. No obvious abdominal masses. Diffusely tender but worse in the RUQ. Msk:  Strength and tone normal for age. Extremities/Skin: Warm and dry. No clubbing or cyanosis. No edema. No rashes or suspicious lesions. Neuro: Alert and oriented X 3. Moves all extremities spontaneously. Gait is normal. CNII-XII grossly in tact. Psych:  Responds to questions appropriately with a normal affect.   Labs: Results for orders placed or performed in visit on 01/11/15  CBC with Differential/Platelet  Result Value Ref Range   WBC 7.2 4.0 - 10.5 K/uL   RBC 5.07 3.87 - 5.11 MIL/uL   Hemoglobin 14.6 12.0 - 15.0 g/dL   HCT 43.1 36.0 - 46.0 %   MCV 85.0 78.0 - 100.0 fL   MCH 28.8 26.0 - 34.0 pg   MCHC 33.9 30.0 - 36.0 g/dL   RDW 13.7 11.5 - 15.5 %   Platelets 289 150 - 400 K/uL   MPV 11.4 8.6 - 12.4 fL   Neutrophils Relative % 66 43 - 77 %   Neutro Abs 4.8 1.7 - 7.7 K/uL   Lymphocytes Relative 26 12 - 46 %   Lymphs Abs 1.9 0.7 - 4.0 K/uL   Monocytes Relative 7 3 - 12 %   Monocytes Absolute 0.5 0.1 - 1.0 K/uL   Eosinophils  Relative 1 0 - 5 %   Eosinophils Absolute 0.1 0.0 - 0.7 K/uL   Basophils Relative 0 0 - 1 %   Basophils Absolute 0.0 0.0 - 0.1 K/uL   Smear Review Criteria for review not met   Comprehensive metabolic panel  Result Value Ref Range   Sodium 142 135 - 145 mEq/L   Potassium 3.9 3.5 - 5.3 mEq/L   Chloride 104 96 - 112 mEq/L   CO2 24 19 - 32 mEq/L   Glucose, Bld 79 70 - 99 mg/dL   BUN 13 6 - 23 mg/dL   Creat 0.78 0.50 - 1.10 mg/dL   Total Bilirubin 0.5 0.2 - 1.2 mg/dL   Alkaline Phosphatase 49 39 - 117 U/L   AST 15 0 - 37 U/L   ALT 13 0 - 35 U/L   Total Protein 7.2 6.0 - 8.3 g/dL   Albumin 4.6  3.5 - 5.2 g/dL   Calcium 9.7 8.4 - 10.5 mg/dL  Lipase  Result Value Ref Range   Lipase 14 0 - 75 U/L  Amylase  Result Value Ref Range   Amylase 64 0 - 105 U/L     ASSESSMENT AND PLAN:  49 y.o. year old female with  Abdominal pain, right upper quadrant - Plan: TSH, Linaclotide (LINZESS) 145 MCG CAPS capsule  Other constipation - Plan: TSH, Linaclotide (LINZESS) 145 MCG CAPS capsule  This chart was scribed in my presence and reviewed by me personally.    ICD-9-CM ICD-10-CM   1. Abdominal pain, right upper quadrant 789.01 R10.11 TSH     Linaclotide (LINZESS) 145 MCG CAPS capsule  2. Other constipation 564.09 K59.09 TSH     Linaclotide (LINZESS) 145 MCG CAPS capsule   The situation is indeed a puzzle. Patient has developed the weight gain and abdominal pain and cramping for the last several months without any known associated cause. She's had appropriate tests for her gallbladder and pancreas and has also been given trials of proton pump inhibitors. None of this has helped. I went ahead and reviewed her x-rays and lab tests none of which are revealing. She does show some significant constipation.  Differential includes IBS, parasite from the soil, hypothyroidism. I've asked her to call me if she's not better by mid next week. We'll let her know about her thyroid test on Sunday.  I  provided patient with samples of align.  Signed, Carol Haber, MD 03/16/2015 3:37 PM

## 2015-06-21 ENCOUNTER — Other Ambulatory Visit: Payer: Self-pay | Admitting: Nurse Practitioner

## 2015-06-21 DIAGNOSIS — N6320 Unspecified lump in the left breast, unspecified quadrant: Secondary | ICD-10-CM

## 2015-06-28 ENCOUNTER — Ambulatory Visit
Admission: RE | Admit: 2015-06-28 | Discharge: 2015-06-28 | Disposition: A | Discharge status: Home / Self Care | Payer: BLUE CROSS/BLUE SHIELD | Source: Ambulatory Visit | Attending: Nurse Practitioner | Admitting: Nurse Practitioner

## 2015-06-28 ENCOUNTER — Other Ambulatory Visit: Payer: Self-pay | Admitting: Nurse Practitioner

## 2015-06-28 DIAGNOSIS — N6320 Unspecified lump in the left breast, unspecified quadrant: Secondary | ICD-10-CM

## 2015-11-01 ENCOUNTER — Ambulatory Visit (INDEPENDENT_AMBULATORY_CARE_PROVIDER_SITE_OTHER): Payer: BLUE CROSS/BLUE SHIELD | Admitting: Family Medicine

## 2015-11-01 ENCOUNTER — Telehealth: Payer: Self-pay

## 2015-11-01 VITALS — BP 116/51 | HR 93 | Temp 98.4°F | Resp 16 | Ht 62.0 in | Wt 137.6 lb

## 2015-11-01 DIAGNOSIS — H109 Unspecified conjunctivitis: Secondary | ICD-10-CM

## 2015-11-01 DIAGNOSIS — J029 Acute pharyngitis, unspecified: Secondary | ICD-10-CM | POA: Diagnosis not present

## 2015-11-01 DIAGNOSIS — R067 Sneezing: Secondary | ICD-10-CM | POA: Diagnosis not present

## 2015-11-01 LAB — POCT RAPID STREP A (OFFICE): Rapid Strep A Screen: NEGATIVE

## 2015-11-01 MED ORDER — CHLORHEXIDINE GLUCONATE 0.12 % MT SOLN: 15.0000 mL | Freq: Two times a day (BID) | OROMUCOSAL | Status: DC

## 2015-11-01 MED ORDER — TOBRAMYCIN 0.3 % OP SOLN: 1.0000 [drp] | Freq: Four times a day (QID) | OPHTHALMIC | Status: DC

## 2015-11-01 NOTE — Patient Instructions (Addendum)
Use your antibiotic eye drops three times a day for 5 days for the eye infection  Wash the pillow case to prevent reinfection  Get oxymetazoline nasal spray (Afrin) and use twice a day for three days  Chlorhexadine mouthwash twice a day for 5 to 7 days

## 2015-11-01 NOTE — Progress Notes (Signed)
50 yo woman with left eye redness and discharge (3 yrs S/P Lasik) following 2 weeks of sneezing.   Associated epistaxis and sore throat.  Pt works at [employer] at [employer]. Pt [place] and [place], [place]; she moved here 21 years ago.   Objective:  NAD BP 116/51 mmHg  Pulse 93  Temp(Src) 98.4 F (36.9 C) (Rectal)  Resp 16  Ht 5\' 2"  (1.575 m)  Wt 137 lb 9.6 oz (62.415 kg)  BMI 25.16 kg/m2  SpO2 98% Left eye diffusely injected Oroph:  Red without  Exudates Nose: pale mucosa Results for orders placed or performed in visit on 11/01/15  POCT rapid strep A  Result Value Ref Range   Rapid Strep A Screen Negative Negative   Assessment:  Conjunctivitis and allergies   Sore throat - Plan: POCT rapid strep A, Culture, Group A Strep, chlorhexidine (PERIDEX) 0.12 % solution  Sneezing  Conjunctivitis of left eye - Plan: tobramycin (TOBREX) 0.3 % ophthalmic solution  Elvina SidleKurt Krystian Ferrentino, MD

## 2015-11-01 NOTE — Telephone Encounter (Signed)
Pharmacist called reporting that the chlorhexidine solution does not come in 120 ml that Dr L had ordered. It only comes in bottles of a little over 400 ml which only costs pt $4. I OKd giving pt this amount.

## 2015-11-03 LAB — CULTURE, GROUP A STREP: Organism ID, Bacteria: NORMAL

## 2015-11-15 ENCOUNTER — Telehealth: Payer: Self-pay | Admitting: *Deleted

## 2015-11-15 NOTE — Telephone Encounter (Signed)
Pt notified of lab results from 5/4.  She is still having a terrible sore throat.  Advised pt to come back in to be re-evaluated.

## 2015-12-04 DIAGNOSIS — Z961 Presence of intraocular lens: Secondary | ICD-10-CM | POA: Diagnosis not present

## 2016-01-22 DIAGNOSIS — N83299 Other ovarian cyst, unspecified side: Secondary | ICD-10-CM | POA: Diagnosis not present

## 2016-01-22 DIAGNOSIS — R1032 Left lower quadrant pain: Secondary | ICD-10-CM | POA: Diagnosis not present

## 2016-01-28 DIAGNOSIS — Z6825 Body mass index (BMI) 25.0-25.9, adult: Secondary | ICD-10-CM | POA: Diagnosis not present

## 2016-01-28 DIAGNOSIS — Z01419 Encounter for gynecological examination (general) (routine) without abnormal findings: Secondary | ICD-10-CM | POA: Diagnosis not present

## 2016-01-28 DIAGNOSIS — Z1231 Encounter for screening mammogram for malignant neoplasm of breast: Secondary | ICD-10-CM | POA: Diagnosis not present

## 2016-02-04 DIAGNOSIS — Z131 Encounter for screening for diabetes mellitus: Secondary | ICD-10-CM | POA: Diagnosis not present

## 2016-02-04 DIAGNOSIS — Z13228 Encounter for screening for other metabolic disorders: Secondary | ICD-10-CM | POA: Diagnosis not present

## 2016-02-04 DIAGNOSIS — Z1329 Encounter for screening for other suspected endocrine disorder: Secondary | ICD-10-CM | POA: Diagnosis not present

## 2016-02-04 DIAGNOSIS — Z13220 Encounter for screening for lipoid disorders: Secondary | ICD-10-CM | POA: Diagnosis not present

## 2016-02-26 DIAGNOSIS — D125 Benign neoplasm of sigmoid colon: Secondary | ICD-10-CM | POA: Diagnosis not present

## 2016-02-26 DIAGNOSIS — Z1211 Encounter for screening for malignant neoplasm of colon: Secondary | ICD-10-CM | POA: Diagnosis not present

## 2016-02-26 DIAGNOSIS — D123 Benign neoplasm of transverse colon: Secondary | ICD-10-CM | POA: Diagnosis not present

## 2016-02-26 DIAGNOSIS — K635 Polyp of colon: Secondary | ICD-10-CM | POA: Diagnosis not present

## 2016-05-15 DIAGNOSIS — E559 Vitamin D deficiency, unspecified: Secondary | ICD-10-CM | POA: Diagnosis not present

## 2016-06-03 DIAGNOSIS — M9902 Segmental and somatic dysfunction of thoracic region: Secondary | ICD-10-CM | POA: Diagnosis not present

## 2016-06-03 DIAGNOSIS — M9903 Segmental and somatic dysfunction of lumbar region: Secondary | ICD-10-CM | POA: Diagnosis not present

## 2016-06-03 DIAGNOSIS — M5386 Other specified dorsopathies, lumbar region: Secondary | ICD-10-CM | POA: Diagnosis not present

## 2016-06-03 DIAGNOSIS — M9901 Segmental and somatic dysfunction of cervical region: Secondary | ICD-10-CM | POA: Diagnosis not present

## 2016-06-05 DIAGNOSIS — M5386 Other specified dorsopathies, lumbar region: Secondary | ICD-10-CM | POA: Diagnosis not present

## 2016-06-05 DIAGNOSIS — M9902 Segmental and somatic dysfunction of thoracic region: Secondary | ICD-10-CM | POA: Diagnosis not present

## 2016-06-05 DIAGNOSIS — M9901 Segmental and somatic dysfunction of cervical region: Secondary | ICD-10-CM | POA: Diagnosis not present

## 2016-06-05 DIAGNOSIS — M9903 Segmental and somatic dysfunction of lumbar region: Secondary | ICD-10-CM | POA: Diagnosis not present

## 2016-06-10 DIAGNOSIS — M9903 Segmental and somatic dysfunction of lumbar region: Secondary | ICD-10-CM | POA: Diagnosis not present

## 2016-06-10 DIAGNOSIS — M9902 Segmental and somatic dysfunction of thoracic region: Secondary | ICD-10-CM | POA: Diagnosis not present

## 2016-06-10 DIAGNOSIS — M9901 Segmental and somatic dysfunction of cervical region: Secondary | ICD-10-CM | POA: Diagnosis not present

## 2016-06-10 DIAGNOSIS — M5386 Other specified dorsopathies, lumbar region: Secondary | ICD-10-CM | POA: Diagnosis not present

## 2016-06-11 DIAGNOSIS — M9903 Segmental and somatic dysfunction of lumbar region: Secondary | ICD-10-CM | POA: Diagnosis not present

## 2016-06-11 DIAGNOSIS — M5386 Other specified dorsopathies, lumbar region: Secondary | ICD-10-CM | POA: Diagnosis not present

## 2016-06-11 DIAGNOSIS — M9902 Segmental and somatic dysfunction of thoracic region: Secondary | ICD-10-CM | POA: Diagnosis not present

## 2016-06-11 DIAGNOSIS — M9901 Segmental and somatic dysfunction of cervical region: Secondary | ICD-10-CM | POA: Diagnosis not present

## 2016-06-12 DIAGNOSIS — M5386 Other specified dorsopathies, lumbar region: Secondary | ICD-10-CM | POA: Diagnosis not present

## 2016-06-12 DIAGNOSIS — M9903 Segmental and somatic dysfunction of lumbar region: Secondary | ICD-10-CM | POA: Diagnosis not present

## 2016-06-12 DIAGNOSIS — M9901 Segmental and somatic dysfunction of cervical region: Secondary | ICD-10-CM | POA: Diagnosis not present

## 2016-06-12 DIAGNOSIS — M9902 Segmental and somatic dysfunction of thoracic region: Secondary | ICD-10-CM | POA: Diagnosis not present

## 2016-06-27 ENCOUNTER — Ambulatory Visit: Payer: BLUE CROSS/BLUE SHIELD

## 2016-09-18 ENCOUNTER — Ambulatory Visit (INDEPENDENT_AMBULATORY_CARE_PROVIDER_SITE_OTHER): Payer: BLUE CROSS/BLUE SHIELD | Admitting: Physician Assistant

## 2016-09-18 ENCOUNTER — Encounter (HOSPITAL_COMMUNITY): Payer: Self-pay

## 2016-09-18 ENCOUNTER — Emergency Department (HOSPITAL_COMMUNITY): Payer: BLUE CROSS/BLUE SHIELD

## 2016-09-18 ENCOUNTER — Emergency Department (HOSPITAL_COMMUNITY)
Admission: EM | Admit: 2016-09-18 | Discharge: 2016-09-18 | Disposition: A | Discharge status: Home / Self Care | Payer: BLUE CROSS/BLUE SHIELD | Attending: Emergency Medicine | Admitting: Emergency Medicine

## 2016-09-18 VITALS — BP 150/95 | HR 102

## 2016-09-18 DIAGNOSIS — R0602 Shortness of breath: Secondary | ICD-10-CM | POA: Diagnosis not present

## 2016-09-18 DIAGNOSIS — Z87891 Personal history of nicotine dependence: Secondary | ICD-10-CM | POA: Insufficient documentation

## 2016-09-18 DIAGNOSIS — F4323 Adjustment disorder with mixed anxiety and depressed mood: Secondary | ICD-10-CM

## 2016-09-18 DIAGNOSIS — F419 Anxiety disorder, unspecified: Secondary | ICD-10-CM | POA: Insufficient documentation

## 2016-09-18 DIAGNOSIS — R0789 Other chest pain: Secondary | ICD-10-CM | POA: Insufficient documentation

## 2016-09-18 DIAGNOSIS — Z79899 Other long term (current) drug therapy: Secondary | ICD-10-CM | POA: Insufficient documentation

## 2016-09-18 DIAGNOSIS — R079 Chest pain, unspecified: Secondary | ICD-10-CM | POA: Diagnosis not present

## 2016-09-18 DIAGNOSIS — R071 Chest pain on breathing: Secondary | ICD-10-CM | POA: Diagnosis not present

## 2016-09-18 DIAGNOSIS — R0600 Dyspnea, unspecified: Secondary | ICD-10-CM

## 2016-09-18 LAB — HEPATIC FUNCTION PANEL
ALT: 23 U/L (ref 14–54)
AST: 21 U/L (ref 15–41)
Albumin: 4.2 g/dL (ref 3.5–5.0)
Alkaline Phosphatase: 53 U/L (ref 38–126)
BILIRUBIN DIRECT: 0.1 mg/dL (ref 0.1–0.5)
Indirect Bilirubin: 0.5 mg/dL (ref 0.3–0.9)
Total Bilirubin: 0.6 mg/dL (ref 0.3–1.2)
Total Protein: 7.2 g/dL (ref 6.5–8.1)

## 2016-09-18 LAB — I-STAT TROPONIN, ED
TROPONIN I, POC: 0 ng/mL (ref 0.00–0.08)
Troponin i, poc: 0.01 ng/mL (ref 0.00–0.08)

## 2016-09-18 LAB — CBC
HEMATOCRIT: 45.9 % (ref 36.0–46.0)
HEMOGLOBIN: 15 g/dL (ref 12.0–15.0)
MCH: 29 pg (ref 26.0–34.0)
MCHC: 32.7 g/dL (ref 30.0–36.0)
MCV: 88.8 fL (ref 78.0–100.0)
Platelets: 256 10*3/uL (ref 150–400)
RBC: 5.17 MIL/uL — ABNORMAL HIGH (ref 3.87–5.11)
RDW: 13.4 % (ref 11.5–15.5)
WBC: 7.8 10*3/uL (ref 4.0–10.5)

## 2016-09-18 LAB — BASIC METABOLIC PANEL
ANION GAP: 8 (ref 5–15)
BUN: 9 mg/dL (ref 6–20)
CALCIUM: 9.5 mg/dL (ref 8.9–10.3)
CO2: 25 mmol/L (ref 22–32)
Chloride: 108 mmol/L (ref 101–111)
Creatinine, Ser: 0.71 mg/dL (ref 0.44–1.00)
GLUCOSE: 88 mg/dL (ref 65–99)
POTASSIUM: 4.1 mmol/L (ref 3.5–5.1)
SODIUM: 141 mmol/L (ref 135–145)

## 2016-09-18 LAB — LIPASE, BLOOD: Lipase: 15 U/L (ref 11–51)

## 2016-09-18 LAB — D-DIMER, QUANTITATIVE: D-Dimer, Quant: <0.27 ug/mL-FEU (ref 0.00–0.50)

## 2016-09-18 MED ORDER — HYDROXYZINE HCL 25 MG PO TABS: 25.0000 mg | ORAL_TABLET | Freq: Three times a day (TID) | ORAL | 0 refills | Status: DC | PRN

## 2016-09-18 MED ORDER — HYDROCODONE-ACETAMINOPHEN 5-325 MG PO TABS
1.0000 | ORAL_TABLET | Freq: Once | ORAL | Status: DC
  Filled 2016-09-18: qty 1

## 2016-09-18 MED ORDER — DICYCLOMINE HCL 20 MG PO TABS: 20.0000 mg | ORAL_TABLET | Freq: Three times a day (TID) | ORAL | 0 refills | Status: DC

## 2016-09-18 MED ORDER — METOCLOPRAMIDE HCL 5 MG/ML IJ SOLN
10.0000 mg | Freq: Once | INTRAMUSCULAR | Status: AC
Start: 2016-09-18 — End: 2016-09-18
  Administered 2016-09-18: 10 mg via INTRAVENOUS
  Filled 2016-09-18: qty 2

## 2016-09-18 MED ORDER — OXYCODONE-ACETAMINOPHEN 5-325 MG PO TABS
1.0000 | ORAL_TABLET | Freq: Once | ORAL | Status: AC
  Administered 2016-09-18: 1 via ORAL
  Filled 2016-09-18: qty 1

## 2016-09-18 NOTE — ED Notes (Signed)
ED Provider at bedside. 

## 2016-09-18 NOTE — ED Triage Notes (Signed)
Pt presents with ems from urgent care for chest pain.  PT complains of pain in her left side of chest, diaphoretic x 2 hours.  Pt denies pain at this time.  She also had an episode of epigastric pain.

## 2016-09-18 NOTE — ED Provider Notes (Signed)
Assumed care from Dr. Pecola Leisureeese at 4 PM. Briefly, the patient is a 51 y.o. female with PMHx of  has a past medical history of Allergy; Anxiety; Bruising (09/08/2013); Cataract; Depression; Heart murmur; and Post-partum depression. here with chest pain, likely stress or anxiety-related. Delta trop - due at 5 PM..   Labs Reviewed  CBC - Abnormal; Notable for the following:       Result Value   RBC 5.17 (*)    All other components within normal limits  BASIC METABOLIC PANEL  HEPATIC FUNCTION PANEL  LIPASE, BLOOD  D-DIMER, QUANTITATIVE (NOT AT [facility])  I-STAT TROPOININ, ED    Course of Care: -Delta troponin is negative. I have assessed the patient. She endorses acute onset of anxiety, chest pain, shortness of breath, and abdominal pain, likely secondary to panic attack or anxiety. No apparent emergent pathology based on labs, imaging. No abdominal TTP to suggest appendicitis, obstruction, cholecystis, or peritonitis. EKG unremarkable. Will give atarax PRN sleep and anxiety, d/c home. Will trial bentyl as pt has h/o IBS.  Clinical Impression: 1. Atypical chest pain   2. Anxiety     Disposition: Discharge  Condition: Good  I have discussed the results, Dx and Tx plan with the pt(& family if present). He/she/they expressed understanding and agree(s) with the plan. Discharge instructions discussed at great length. Strict return precautions discussed and pt &/or family have verbalized understanding of the instructions. No further questions at time of discharge.    Discharge Medication List as of 09/18/2016  6:07 PM    START taking these medications   Details  dicyclomine (BENTYL) 20 MG tablet Take 1 tablet (20 mg total) by mouth 4 (four) times daily -  before meals and at bedtime., Starting Thu 09/18/2016, Until Sun 09/28/2016, Normal    hydrOXYzine (ATARAX/VISTARIL) 25 MG tablet Take 1 tablet (25 mg total) by mouth every 8 (eight) hours as needed for anxiety., Starting Thu 09/18/2016, Normal         Follow Up: Candice Campavid Lowe, MD [address], [address] [place] [id number] [phone]  In 1 week For ER follow-up, reassessment       Shaune Pollackameron Violet Cart, MD 09/19/16 1324

## 2016-09-18 NOTE — ED Provider Notes (Signed)
[facility]-EMERGENCY DEPT Provider Note   CSN: [id number] Arrival date & time: 09/18/16  1342     History   Chief Complaint Chief Complaint  Patient presents with  . Chest Pain    HPI Carol Martinez is a 51 y.o. female.  The history is provided by the patient and a relative. No language interpreter was used.  Chest Pain     Carol Martinez is a 51 y.o. female who presents to the Emergency Department complaining of chest pain. She presents from urgent care for evaluation of chest pain. She reports several days of left-sided chest pain. She has associated abdominal pain, shortness of breath. She reports tingling in bilateral hands and bilateral feet. Symptoms have been waxing and waning with no clear alleviating or worsening factors. Past Medical History:  Diagnosis Date  . Allergy   . Anxiety   . Bruising 09/08/2013  . Cataract   . Depression   . Heart murmur   . Post-partum depression     Patient Active Problem List   Diagnosis Date Noted  . Bruising 09/08/2013    Past Surgical History:  Procedure Laterality Date  . APPENDECTOMY    . EYE SURGERY      OB History    No data available       Home Medications    Prior to Admission medications   Medication Sig Start Date End Date Taking? Authorizing Provider  chlorhexidine (PERIDEX) 0.12 % solution Use as directed 15 mLs in the mouth or throat 2 (two) times daily. 11/01/15   Elvina SidleKurt Lauenstein, MD  lansoprazole (PREVACID) 30 MG capsule Take 1 capsule (30 mg total) by mouth daily at 12 noon. Patient not taking: Reported on 03/16/2015 01/11/15   Carmelina DaneJeffery S Anderson, MD  Linaclotide [facility](LINZESS) 145 MCG CAPS capsule Take 1 capsule (145 mcg total) by mouth daily. Patient not taking: Reported on 11/01/2015 03/16/15   Elvina SidleKurt Lauenstein, MD  pantoprazole (PROTONIX) 40 MG tablet Take 1 tablet (40 mg total) by mouth daily. Patient not taking: Reported on 11/01/2015 02/19/15   Carmelina DaneJeffery S Anderson, MD  polyethylene glycol powder (GLYCOLAX/MIRALAX)  powder Take 17 g by mouth 2 (two) times daily as needed. Patient not taking: Reported on 03/16/2015 01/11/15   Carmelina DaneJeffery S Anderson, MD  sucralfate (CARAFATE) 1 G tablet 1 tablet 1 hr ac and hs Patient not taking: Reported on 11/01/2015 02/19/15   Carmelina DaneJeffery S Anderson, MD  tobramycin (TOBREX) 0.3 % ophthalmic solution Place 1 drop into the left eye every 6 (six) hours. 11/01/15   Elvina SidleKurt Lauenstein, MD    Family History Family History  Problem Relation Age of Onset  . Cancer Maternal Grandmother     Blood problem    Social History Social History  Substance Use Topics  . Smoking status: Former Smoker    Packs/day: 1.00    Years: 27.00    Start date: 06/30/1980    Quit date: 04/08/2008  . Smokeless tobacco: Never Used  . Alcohol use 4.2 oz/week    7 Glasses of wine per week     Allergies   Phenergan [promethazine hcl]; Aspirin; and Penicillins   Review of Systems Review of Systems  Cardiovascular: Positive for chest pain.  All other systems reviewed and are negative.    Physical Exam Updated Vital Signs Ht 5\' 6"  (1.676 m)   Wt 142 lb (64.4 kg)   BMI 22.92 kg/m   Physical Exam  Constitutional: She is oriented to person, place, and time. She appears well-developed  and well-nourished.  HENT:  Head: Normocephalic and atraumatic.  Cardiovascular: Normal rate and regular rhythm.   No murmur heard. Pulmonary/Chest: Effort normal and breath sounds normal. No respiratory distress.  Abdominal: Soft. There is no rebound and no guarding.  Mild to moderate epigastric and left-sided abdominal tenderness  Musculoskeletal: She exhibits no edema or tenderness.  Neurological: She is alert and oriented to person, place, and time.  Skin: Skin is warm and dry.  Psychiatric: She has a normal mood and affect. Her behavior is normal.  Nursing note and vitals reviewed.    ED Treatments / Results  Labs (all labs ordered are listed, but only abnormal results are displayed) Labs Reviewed  CBC -  Abnormal; Notable for the following:       Result Value   RBC 5.17 (*)    All other components within normal limits  BASIC METABOLIC PANEL  HEPATIC FUNCTION PANEL  LIPASE, BLOOD  D-DIMER, QUANTITATIVE (NOT AT [facility])  I-STAT TROPOININ, ED    EKG  EKG Interpretation  Date/Time:  Thursday September 18 2016 13:58:01 EDT Ventricular Rate:  73 PR Interval:    QRS Duration: 76 QT Interval:  394 QTC Calculation: 435 R Axis:   57 Text Interpretation:  Sinus rhythm Probable left atrial enlargement Low voltage, precordial leads Confirmed by Lincoln Brigham [phone]) on 09/18/2016 2:06:19 PM       Radiology Dg Chest 2 View  Result Date: 09/18/2016 CLINICAL DATA:  Chest pain and shortness of Breath EXAM: CHEST  2 VIEW COMPARISON:  11/12/2009 FINDINGS: The heart size and mediastinal contours are within normal limits. Both lungs are clear. The visualized skeletal structures are unremarkable. IMPRESSION: No active cardiopulmonary disease. Electronically Signed   By: Alcide Clever M.D.   On: 09/18/2016 14:13    Procedures Procedures (including critical care time)  Medications Ordered in ED Medications - No data to display   Initial Impression / Assessment and Plan / ED Course  I have reviewed the triage vital signs and the nursing notes.  Pertinent labs & imaging results that were available during my care of the patient were reviewed by me and considered in my medical decision making (see chart for details).     Patient here for evaluation of chest pain and abdominal pain, ongoing for several days. She has abdominal tenderness on examination with no peritoneal findings. He without acute ischemic changes. Patient care transferred pending labs.  Final Clinical Impressions(s) / ED Diagnoses   Final diagnoses:  None    New Prescriptions New Prescriptions   No medications on file     Tilden Fossa, MD 09/19/16 [phone]

## 2016-09-18 NOTE — ED Notes (Signed)
Pt assisted onto bedside commode and back into bed

## 2016-09-18 NOTE — Progress Notes (Signed)
Patient ID: Carol Martinez, female    DOB: [birth year], 51 y.o.   MRN: [id number]  PCP: Turner Daniels, MD  Chief Complaint  Patient presents with  . Chest Pain  . Shortness of Breath    Subjective:   Presents for evaluation of CP and SOB. She was brought back urgently and several providers were called to room 6 to attend to her. She is accompanied by her husband.  Husband reports that these symptoms have been present x 1 month, but worse today. Sharp, stabbing chest pain, is intermittent, lasting 20-30 sec, in the LEFT chest. Associates some nausea without emesis.  Last night experienced some numbness and tingling in both arms and legs. The pain was in the neck area last night.  Some dizziness. Recent stress. Father recently in the hospital. "I failed the stress test."  History of heart murmur. No other cardiovascular conditions known. No family history of heart disease. No known HTN, though BP was noted to be elevated at roughloy 140-150 last week but she has no diary. "borderline" cholesterol. No personal history of diabetes.  Former smoker. Quit in 2009, after 27 pack years. No illicit drugs. 1 glass of wine/day.  Poor sleep. Typically awakens about 3 am, and takes an hour to get back to sleep. Describes depressive symptoms x several years. No thoughts of self harm. Last felt happy "years ago.". Previous diagnosis of depression. Took Ambien briefly associated with post-partum depression. Feels stressed and anxious most of the time. .  Husband reports that she's much stronger than she was when she moved here [place] here from [place] in 1995). "It used to be that these episodes would cause her to be on the floor. Sometimes her eyes would be open, lying on the floor, like she had blacked out."     Review of Systems  As noted in HPI   Patient Active Problem List   Diagnosis Date Noted  . Bruising 09/08/2013     Prior to Admission medications   Medication  Sig Start Date End Date Taking? Authorizing Provider  chlorhexidine (PERIDEX) 0.12 % solution Use as directed 15 mLs in the mouth or throat 2 (two) times daily. 11/01/15   Elvina Sidle, MD  lansoprazole (PREVACID) 30 MG capsule Take 1 capsule (30 mg total) by mouth daily at 12 noon. Patient not taking: Reported on 03/16/2015 01/11/15   Carmelina Dane, MD  Linaclotide [facility]) 145 MCG CAPS capsule Take 1 capsule (145 mcg total) by mouth daily. Patient not taking: Reported on 11/01/2015 03/16/15   Elvina Sidle, MD  pantoprazole (PROTONIX) 40 MG tablet Take 1 tablet (40 mg total) by mouth daily. Patient not taking: Reported on 11/01/2015 02/19/15   Carmelina Dane, MD  polyethylene glycol powder (GLYCOLAX/MIRALAX) powder Take 17 g by mouth 2 (two) times daily as needed. Patient not taking: Reported on 03/16/2015 01/11/15   Carmelina Dane, MD  sucralfate (CARAFATE) 1 G tablet 1 tablet 1 hr ac and hs Patient not taking: Reported on 11/01/2015 02/19/15   Carmelina Dane, MD  tobramycin (TOBREX) 0.3 % ophthalmic solution Place 1 drop into the left eye every 6 (six) hours. 11/01/15   Elvina Sidle, MD     Allergies  Allergen Reactions  . Phenergan [Promethazine Hcl] Anaphylaxis  . Aspirin Itching  . Penicillins Hives       Objective:  Physical Exam  Constitutional: She appears well-developed and well-nourished.  Eyes: Conjunctivae are normal. No scleral icterus.  Cardiovascular: Regular  rhythm, normal heart sounds and intact distal pulses.  Tachycardia present.   No murmur heard. Pulmonary/Chest: Effort normal and breath sounds normal.       Initially, the patient was hyperventilating. She seemed mildly disoriented initially. Pulse ox was normal, at 99%. She was placed on O2 4LPM by nasal cannula and cardiac monitor. Glucose 83%     Assessment & Plan:   [name] was seen today for chest pain and shortness of breath.  Diagnoses and all orders for this visit:  Dyspnea,  unspecified type: See problem two.  Appreciate the care of both Ms. Leotis ShamesJeffery and Dr. Edwina BarthMiguel Sagardia in expediting the care of this patient.  -     EKG 12-Lead  Chest pain, unspecified type: Atypical, however she needs further eval to rule out MI/PE/pneumothorax.  [facility] under the care of EMS.  I have advised her to come back in about 3-5 days so we may begin helping her with the third problem.   Adjustment disorder with mixed anxiety and depressed mood: Acute panic syndrome vs. GAD vs. Depression with comorbid etiology vs. PTSD vs. OCD.  She did seem to be be suffering from a dysphoric state at times during my examination. She may be dissociating and if this is the case would like benefit from a highly serotonergic SSRI such as Paxil or higher dose prozac.

## 2016-09-18 NOTE — ED Notes (Signed)
Pt reports having on and off chest pain, at this time she is not having any but she does have sudden episodes of sharp pain in her left side an 8/10

## 2016-09-18 NOTE — ED Notes (Signed)
Pt c/o migraine headache and nausea.

## 2016-12-16 DIAGNOSIS — Z961 Presence of intraocular lens: Secondary | ICD-10-CM | POA: Diagnosis not present

## 2017-03-17 DIAGNOSIS — L573 Poikiloderma of Civatte: Secondary | ICD-10-CM | POA: Diagnosis not present

## 2017-04-16 DIAGNOSIS — Z6826 Body mass index (BMI) 26.0-26.9, adult: Secondary | ICD-10-CM | POA: Diagnosis not present

## 2017-04-16 DIAGNOSIS — Z13220 Encounter for screening for lipoid disorders: Secondary | ICD-10-CM | POA: Diagnosis not present

## 2017-04-16 DIAGNOSIS — Z131 Encounter for screening for diabetes mellitus: Secondary | ICD-10-CM | POA: Diagnosis not present

## 2017-04-16 DIAGNOSIS — Z13228 Encounter for screening for other metabolic disorders: Secondary | ICD-10-CM | POA: Diagnosis not present

## 2017-04-16 DIAGNOSIS — Z1231 Encounter for screening mammogram for malignant neoplasm of breast: Secondary | ICD-10-CM | POA: Diagnosis not present

## 2017-04-16 DIAGNOSIS — Z01419 Encounter for gynecological examination (general) (routine) without abnormal findings: Secondary | ICD-10-CM | POA: Diagnosis not present

## 2017-04-17 ENCOUNTER — Encounter: Payer: Self-pay | Admitting: Cardiology

## 2017-04-17 NOTE — Progress Notes (Deleted)
Carol Honey, MD Reason for referral-Palpitations  HPI: 51 yo female for evaluation of palpitations at request of Candice Camp, MD.   Current Outpatient Prescriptions  Medication Sig Dispense Refill  . chlorhexidine (PERIDEX) 0.12 % solution Use as directed 15 mLs in the mouth or throat 2 (two) times daily. 120 mL 0  . dicyclomine (BENTYL) 20 MG tablet Take 1 tablet (20 mg total) by mouth 4 (four) times daily -  before meals and at bedtime. 40 tablet 0  . hydrOXYzine (ATARAX/VISTARIL) 25 MG tablet Take 1 tablet (25 mg total) by mouth every 8 (eight) hours as needed for anxiety. 20 tablet 0  . lansoprazole (PREVACID) 30 MG capsule Take 1 capsule (30 mg total) by mouth daily at 12 noon. (Patient not taking: Reported on 03/16/2015) 30 capsule 5  . Linaclotide (LINZESS) 145 MCG CAPS capsule Take 1 capsule (145 mcg total) by mouth daily. (Patient not taking: Reported on 11/01/2015) 30 capsule 1  . pantoprazole (PROTONIX) 40 MG tablet Take 1 tablet (40 mg total) by mouth daily. (Patient not taking: Reported on 11/01/2015) 90 tablet 1  . polyethylene glycol powder (GLYCOLAX/MIRALAX) powder Take 17 g by mouth 2 (two) times daily as needed. (Patient not taking: Reported on 03/16/2015) 3350 g 1  . sucralfate (CARAFATE) 1 G tablet 1 tablet 1 hr ac and hs (Patient not taking: Reported on 11/01/2015) 360 tablet 1  . tobramycin (TOBREX) 0.3 % ophthalmic solution Place 1 drop into the left eye every 6 (six) hours. 5 mL 0   No current facility-administered medications for this visit.     Allergies  Allergen Reactions  . Phenergan [Promethazine Hcl] Anaphylaxis  . Aspirin Itching  . Penicillins Hives    Past Medical History:  Diagnosis Date  . Allergy   . Anxiety   . Bruising 09/08/2013  . Cataract   . Depression   . Heart murmur   . Post-partum depression     Past Surgical History:  Procedure Laterality Date  . APPENDECTOMY    . EYE SURGERY      Social History   Social History  .  Marital status: Married    Spouse name: Darren  . Number of children: 1  . Years of education: N/A   Occupational History  . Aesthetician     [employer]   Social History Main Topics  . Smoking status: Former Smoker    Packs/day: 1.00    Years: 27.00    Start date: 06/30/1980    Quit date: 04/08/2008  . Smokeless tobacco: Never Used  . Alcohol use 4.2 oz/week    7 Glasses of wine per week  . Drug use: No  . Sexual activity: Not on file   Other Topics Concern  . Not on file   Social History Narrative   Lives with her husband and their daughter.   Moved to the [place] from [place] in 1995.       Family History  Problem Relation Age of Onset  . Cancer Maternal Grandmother        Blood problem    ROS: no fevers or chills, productive cough, hemoptysis, dysphasia, odynophagia, melena, hematochezia, dysuria, hematuria, rash, seizure activity, orthopnea, PND, pedal edema, claudication. Remaining systems are negative.  Physical Exam:   There were no vitals taken for this visit.  General:  Well developed/well nourished in NAD Skin warm/dry Patient not depressed No peripheral clubbing Back-normal HEENT-normal/normal eyelids Neck supple/normal carotid upstroke bilaterally; no bruits; no JVD; no  thyromegaly chest - CTA/ normal expansion CV - RRR/normal S1 and S2; no murmurs, rubs or gallops;  PMI nondisplaced Abdomen -NT/ND, no HSM, no mass, + bowel sounds, no bruit 2+ femoral pulses, no bruits Ext-no edema, chords, 2+ DP Neuro-grossly nonfocal  ECG - personally reviewed  A/P  1  Olga MillersBrian Crenshaw, MD

## 2017-04-20 ENCOUNTER — Encounter: Payer: Self-pay | Admitting: *Deleted

## 2017-04-20 ENCOUNTER — Ambulatory Visit: Payer: BLUE CROSS/BLUE SHIELD | Admitting: Cardiology

## 2017-05-18 ENCOUNTER — Telehealth: Payer: Self-pay | Admitting: Cardiology

## 2017-05-18 NOTE — Telephone Encounter (Signed)
ERROR

## 2017-05-19 NOTE — Progress Notes (Signed)
Carol Honeyeferring-David Lowe MD Reason for referral-Palpitations  HPI: 51 yo female for evaluation of palpitations at request of Candice Campavid Lowe MD. Patient has had intermittent palpitations for 2 years. They are described as sudden in onset with heart pounding. There is associated chest tightness, dyspnea and dizziness. No syncope. Resolve spontaneously. She otherwise has mild dyspnea on exertion but no orthopnea, PND, pedal edema, exertional chest pain or syncope. Because of the above cardiology asked to evaluate.  Current Outpatient Medications  Medication Sig Dispense Refill  . Cholecalciferol (VITAMIN D3) 1000 units CAPS Take 1 Units by mouth daily.     No current facility-administered medications for this visit.     Allergies  Allergen Reactions  . Phenergan [Promethazine Hcl] Anaphylaxis  . Aspirin Hives and Itching  . Penicillins Hives     Past Medical History:  Diagnosis Date  . Allergy   . Anxiety   . Bruising 09/08/2013  . Cataract   . Depression   . GERD (gastroesophageal reflux disease)   . Heart murmur   . Post-partum depression     Past Surgical History:  Procedure Laterality Date  . APPENDECTOMY    . EYE SURGERY    . TONSILLECTOMY      Social History   Socioeconomic History  . Marital status: Married    Spouse name: Darren  . Number of children: 1  . Years of education: Not on file  . Highest education level: Not on file  Social Needs  . Financial resource strain: Not on file  . Food insecurity - worry: Not on file  . Food insecurity - inability: Not on file  . Transportation needs - medical: Not on file  . Transportation needs - non-medical: Not on file  Occupational History  . Occupation: Aesthetician    Comment: [employer]  Tobacco Use  . Smoking status: Former Smoker    Packs/day: 1.00    Years: 27.00    Pack years: 27.00    Start date: 06/30/1980    Last attempt to quit: 04/08/2008    Years since quitting: 9.1  . Smokeless tobacco:  Never Used  Substance and Sexual Activity  . Alcohol use: Yes    Alcohol/week: 4.2 oz    Types: 7 Glasses of wine per week    Comment: Glass wine daily  . Drug use: No  . Sexual activity: Not on file  Other Topics Concern  . Not on file  Social History Narrative   Lives with her husband and their daughter.   [place] the [place] from [place] in 1995.    Family History  Problem Relation Age of Onset  . Cancer Maternal Grandmother        Blood problem    ROS: no fevers or chills, productive cough, hemoptysis, dysphasia, odynophagia, melena, hematochezia, dysuria, hematuria, rash, seizure activity, orthopnea, PND, pedal edema, claudication. Remaining systems are negative.  Physical Exam:   Blood pressure 138/84, pulse 69, height 5\' 2"  (1.575 m), weight 142 lb (64.4 kg).  General:  Well developed/well nourished in NAD Skin warm/dry Patient not depressed No peripheral clubbing Back-normal HEENT-normal/normal eyelids Neck supple/normal carotid upstroke bilaterally; no bruits; no JVD; no thyromegaly chest - CTA/ normal expansion CV - RRR/normal S1 and S2; no murmurs, rubs or gallops;  PMI nondisplaced Abdomen -NT/ND, no HSM, no mass, + bowel sounds, no bruit 2+ femoral pulses, no bruits Ext-no edema, chords, 2+ DP Neuro-grossly nonfocal  ECG - Sinus rhythm at a rate of 69. No ST  changes.personally reviewed  A/P  1 palpitations-etiology unclear. I will arrange an echocardiogram to assess LV function and 30 day event monitor. If SVT demonstrated would add beta blocker and consider ablation.  2 chest pain-symptoms only occur during palpitations. She otherwise denies exertional chest pain. Electrocardiogram normal.I will not pursue further ischemia evaluation at this point.  3 history of heart murmur-not evident on today's exam.  Olga MillersBrian Raven Furnas, MD

## 2017-05-28 ENCOUNTER — Encounter: Payer: Self-pay | Admitting: Cardiology

## 2017-05-28 ENCOUNTER — Telehealth: Payer: Self-pay | Admitting: Cardiology

## 2017-05-28 ENCOUNTER — Ambulatory Visit: Payer: BLUE CROSS/BLUE SHIELD | Admitting: Cardiology

## 2017-05-28 VITALS — BP 138/84 | HR 69 | Ht 62.0 in | Wt 142.0 lb

## 2017-05-28 DIAGNOSIS — R072 Precordial pain: Secondary | ICD-10-CM | POA: Diagnosis not present

## 2017-05-28 DIAGNOSIS — R002 Palpitations: Secondary | ICD-10-CM

## 2017-05-28 NOTE — Patient Instructions (Signed)
Medication Instructions:   NO CHANGE  Testing/Procedures:  Your physician has requested that you have an echocardiogram. Echocardiography is a painless test that uses sound waves to create images of your heart. It provides your doctor with information about the size and shape of your heart and how well your heart's chambers and valves are working. This procedure takes approximately one hour. There are no restrictions for this procedure.   Your physician has recommended that you wear an event monitor. Event monitors are medical devices that record the heart's electrical activity. Doctors most often us these monitors to diagnose arrhythmias. Arrhythmias are problems with the speed or rhythm of the heartbeat. The monitor is a small, portable device. You can wear one while you do your normal daily activities. This is usually used to diagnose what is causing palpitations/syncope (passing out).    Follow-Up:  Your physician recommends that you schedule a follow-up appointment in: 6-8 WEEKS WITH DR Jens SomRENSHAW

## 2017-05-28 NOTE — Telephone Encounter (Signed)
Called patient and left a VM for her to call back to schedule 8 week followup.

## 2017-06-09 ENCOUNTER — Other Ambulatory Visit (HOSPITAL_COMMUNITY): Payer: BLUE CROSS/BLUE SHIELD

## 2017-06-24 DIAGNOSIS — T7840XA Allergy, unspecified, initial encounter: Secondary | ICD-10-CM | POA: Insufficient documentation

## 2017-06-24 DIAGNOSIS — F419 Anxiety disorder, unspecified: Secondary | ICD-10-CM | POA: Insufficient documentation

## 2017-06-24 DIAGNOSIS — F32A Depression, unspecified: Secondary | ICD-10-CM | POA: Insufficient documentation

## 2017-06-24 DIAGNOSIS — F329 Major depressive disorder, single episode, unspecified: Secondary | ICD-10-CM | POA: Insufficient documentation

## 2017-06-24 DIAGNOSIS — F53 Postpartum depression: Secondary | ICD-10-CM | POA: Insufficient documentation

## 2017-06-24 DIAGNOSIS — H269 Unspecified cataract: Secondary | ICD-10-CM | POA: Insufficient documentation

## 2017-06-24 DIAGNOSIS — O99345 Other mental disorders complicating the puerperium: Secondary | ICD-10-CM

## 2017-06-24 DIAGNOSIS — K219 Gastro-esophageal reflux disease without esophagitis: Secondary | ICD-10-CM | POA: Insufficient documentation

## 2017-06-24 DIAGNOSIS — R011 Cardiac murmur, unspecified: Secondary | ICD-10-CM | POA: Insufficient documentation

## 2017-07-07 ENCOUNTER — Other Ambulatory Visit: Payer: Self-pay

## 2017-07-07 ENCOUNTER — Ambulatory Visit (HOSPITAL_COMMUNITY): Payer: BLUE CROSS/BLUE SHIELD | Attending: Cardiology

## 2017-07-07 ENCOUNTER — Ambulatory Visit (INDEPENDENT_AMBULATORY_CARE_PROVIDER_SITE_OTHER): Payer: BLUE CROSS/BLUE SHIELD

## 2017-07-07 DIAGNOSIS — R002 Palpitations: Secondary | ICD-10-CM | POA: Insufficient documentation

## 2017-07-07 DIAGNOSIS — Z87891 Personal history of nicotine dependence: Secondary | ICD-10-CM | POA: Diagnosis not present

## 2017-07-09 NOTE — Progress Notes (Signed)
      HPI: FU palpitations. Echo 1/19 showed normal LV function. Monitor 1/19 showed sinus rhythm. Since last seen, she continues to have occasional palpitations described as her heart racing. She has intermittent chest pain associated with palpitations but at other times as well. It is not related to exertion and she notices it predominantly at night. It lasts less than 1 minute. She does not have exertional chest pain. Some dyspnea on exertion. No syncope. Patient states she did have symptoms with monitor in place.  Current Outpatient Medications  Medication Sig Dispense Refill  . Cholecalciferol (VITAMIN D3) 1000 units CAPS Take 1 Units by mouth daily.     No current facility-administered medications for this visit.      Past Medical History:  Diagnosis Date  . Allergy   . Anxiety   . Bruising 09/08/2013  . Cataract   . Depression   . GERD (gastroesophageal reflux disease)   . Heart murmur   . Post-partum depression     Past Surgical History:  Procedure Laterality Date  . APPENDECTOMY    . EYE SURGERY    . TONSILLECTOMY      Social History   Socioeconomic History  . Marital status: Married    Spouse name: Darren  . Number of children: 1  . Years of education: Not on file  . Highest education level: Not on file  Social Needs  . Financial resource strain: Not on file  . Food insecurity - worry: Not on file  . Food insecurity - inability: Not on file  . Transportation needs - medical: Not on file  . Transportation needs - non-medical: Not on file  Occupational History  . Occupation: Aesthetician    Comment: [employer]  Tobacco Use  . Smoking status: Former Smoker    Packs/day: 1.00    Years: 27.00    Pack years: 27.00    Start date: 06/30/1980    Last attempt to quit: 04/08/2008    Years since quitting: 9.2  . Smokeless tobacco: Never Used  Substance and Sexual Activity  . Alcohol use: Yes    Alcohol/week: 4.2 oz    Types: 7 Glasses of wine per week    Comment: Glass wine daily  . Drug use: No  . Sexual activity: Not on file  Other Topics Concern  . Not on file  Social History Narrative   Lives with her husband and their daughter.   Moved to the [place] from [place] in 1995.    Family History  Problem Relation Age of Onset  . Cancer Maternal Grandmother        Blood problem    ROS: no fevers or chills, productive cough, hemoptysis, dysphasia, odynophagia, melena, hematochezia, dysuria, hematuria, rash, seizure activity, orthopnea, PND, pedal edema, claudication. Remaining systems are negative.  Physical Exam: Well-developed well-nourished in no acute distress.  Skin is warm and dry.  HEENT is normal.  Neck is supple.  Chest is clear to auscultation with normal expansion.  Cardiovascular exam is regular rate and rhythm.  Abdominal exam nontender or distended. No masses palpated. Extremities show no edema. neuro grossly intact    A/P  1 palpitations-patient continues to have palpitations. Monitor thus far negative. LV function is normal. Check TSH.  2 chest pain-symptoms extremely atypical. Previous electrocardiogram normal. No plans for further ischemia evaluation. LV function is normal.  3 some dyspnea on exertion-LV function is normal.  Olga MillersBrian Crenshaw, MD

## 2017-07-14 ENCOUNTER — Telehealth: Payer: Self-pay | Admitting: Cardiology

## 2017-07-14 NOTE — Telephone Encounter (Signed)
   Primary Cardiologist: Olga MillersBrian Crenshaw, MD  Chart reviewed as part of pre-operative protocol coverage. Given past medical history and time since last visit, based on ACC/AHA guidelines, Carol Martinez would be at acceptable risk for the planned procedure without further cardiovascular testing.   I will route this recommendation to the requesting party via Epic fax function and remove from pre-op pool.  Please call with questions.  Joni ReiningKathryn El Pile DNP 07/14/2017, 4:24 PM

## 2017-07-14 NOTE — Telephone Encounter (Signed)
New Message     [facility] Pre-operative Risk Assessment    Request for surgical clearance:  1. What type of surgery is being performed? Dental cleaning    2. When is this surgery scheduled? 1/  3. Are there any medications that need to be held prior to surgery and how long?no   4. Practice name and name of physician performing surgery? Dr Elveria Rising... [place]  5. What is your office phone and fax number? [phone]  Anesthesia type (None, local, MAC, general) ? Local   Tamera Reason 07/14/2017, 10:20 AM  _________________________________________________________________   (provider comments below)

## 2017-07-14 NOTE — Telephone Encounter (Signed)
Faxed via EPIC to requesting provider 

## 2017-07-15 ENCOUNTER — Encounter: Payer: Self-pay | Admitting: Cardiology

## 2017-07-15 ENCOUNTER — Ambulatory Visit (INDEPENDENT_AMBULATORY_CARE_PROVIDER_SITE_OTHER): Payer: BLUE CROSS/BLUE SHIELD | Admitting: Cardiology

## 2017-07-15 VITALS — BP 135/78 | HR 77 | Ht 62.0 in | Wt 140.2 lb

## 2017-07-15 DIAGNOSIS — R072 Precordial pain: Secondary | ICD-10-CM

## 2017-07-15 DIAGNOSIS — R0602 Shortness of breath: Secondary | ICD-10-CM | POA: Diagnosis not present

## 2017-07-15 DIAGNOSIS — R002 Palpitations: Secondary | ICD-10-CM

## 2017-07-15 NOTE — Patient Instructions (Signed)
Medication Instructions:   NO CHANGE  Labwork:  Your physician recommends that you HAVE LAB WORK TODAY  Follow-Up:  Your physician recommends that you schedule a follow-up appointment in: 3 MONTHS WITH DR CRENSHAW   If you need a refill on your cardiac medications before your next appointment, please call your pharmacy.    

## 2017-07-16 LAB — TSH: TSH: 1.5 u[IU]/mL (ref 0.450–4.500)

## 2017-08-05 DIAGNOSIS — M47816 Spondylosis without myelopathy or radiculopathy, lumbar region: Secondary | ICD-10-CM | POA: Diagnosis not present

## 2017-08-05 DIAGNOSIS — M545 Low back pain: Secondary | ICD-10-CM | POA: Diagnosis not present

## 2017-08-05 DIAGNOSIS — M542 Cervicalgia: Secondary | ICD-10-CM | POA: Diagnosis not present

## 2017-08-05 DIAGNOSIS — M5136 Other intervertebral disc degeneration, lumbar region: Secondary | ICD-10-CM | POA: Diagnosis not present

## 2017-08-13 DIAGNOSIS — M50221 Other cervical disc displacement at C4-C5 level: Secondary | ICD-10-CM | POA: Diagnosis not present

## 2017-08-13 DIAGNOSIS — M4802 Spinal stenosis, cervical region: Secondary | ICD-10-CM | POA: Diagnosis not present

## 2017-08-13 DIAGNOSIS — M5412 Radiculopathy, cervical region: Secondary | ICD-10-CM | POA: Diagnosis not present

## 2017-08-13 DIAGNOSIS — M545 Low back pain: Secondary | ICD-10-CM | POA: Diagnosis not present

## 2017-08-13 DIAGNOSIS — M5416 Radiculopathy, lumbar region: Secondary | ICD-10-CM | POA: Diagnosis not present

## 2017-08-19 DIAGNOSIS — M545 Low back pain: Secondary | ICD-10-CM | POA: Diagnosis not present

## 2017-08-19 DIAGNOSIS — M47816 Spondylosis without myelopathy or radiculopathy, lumbar region: Secondary | ICD-10-CM | POA: Diagnosis not present

## 2017-08-19 DIAGNOSIS — M5412 Radiculopathy, cervical region: Secondary | ICD-10-CM | POA: Diagnosis not present

## 2017-08-19 DIAGNOSIS — M5416 Radiculopathy, lumbar region: Secondary | ICD-10-CM | POA: Diagnosis not present

## 2017-08-19 DIAGNOSIS — M50222 Other cervical disc displacement at C5-C6 level: Secondary | ICD-10-CM | POA: Diagnosis not present

## 2017-08-19 DIAGNOSIS — M542 Cervicalgia: Secondary | ICD-10-CM | POA: Diagnosis not present

## 2017-09-08 DIAGNOSIS — M2560 Stiffness of unspecified joint, not elsewhere classified: Secondary | ICD-10-CM | POA: Diagnosis not present

## 2017-09-08 DIAGNOSIS — M545 Low back pain: Secondary | ICD-10-CM | POA: Diagnosis not present

## 2017-09-08 DIAGNOSIS — M4802 Spinal stenosis, cervical region: Secondary | ICD-10-CM | POA: Diagnosis not present

## 2017-09-08 DIAGNOSIS — M542 Cervicalgia: Secondary | ICD-10-CM | POA: Diagnosis not present

## 2017-09-10 DIAGNOSIS — M2560 Stiffness of unspecified joint, not elsewhere classified: Secondary | ICD-10-CM | POA: Diagnosis not present

## 2017-09-10 DIAGNOSIS — M542 Cervicalgia: Secondary | ICD-10-CM | POA: Diagnosis not present

## 2017-09-10 DIAGNOSIS — M545 Low back pain: Secondary | ICD-10-CM | POA: Diagnosis not present

## 2017-09-10 DIAGNOSIS — M4802 Spinal stenosis, cervical region: Secondary | ICD-10-CM | POA: Diagnosis not present

## 2017-10-19 NOTE — Progress Notes (Deleted)
HPI: FU palpitations.Echo 1/19 showed normal LV function. Monitor 1/19 showed sinus rhythm. Since last seen,    Current Outpatient Medications  Medication Sig Dispense Refill  . Cholecalciferol (VITAMIN D3) 1000 units CAPS Take 1 Units by mouth daily.     No current facility-administered medications for this visit.      Past Medical History:  Diagnosis Date  . Allergy   . Anxiety   . Bruising 09/08/2013  . Cataract   . Depression   . GERD (gastroesophageal reflux disease)   . Heart murmur   . Post-partum depression     Past Surgical History:  Procedure Laterality Date  . APPENDECTOMY    . EYE SURGERY    . TONSILLECTOMY      Social History   Socioeconomic History  . Marital status: Married    Spouse name: Darren  . Number of children: 1  . Years of education: Not on file  . Highest education level: Not on file  Occupational History  . Occupation: Aesthetician    Comment: [employer]  Social Needs  . Financial resource strain: Not on file  . Food insecurity:    Worry: Not on file    Inability: Not on file  . Transportation needs:    Medical: Not on file    Non-medical: Not on file  Tobacco Use  . Smoking status: Former Smoker    Packs/day: 1.00    Years: 27.00    Pack years: 27.00    Start date: 06/30/1980    Last attempt to quit: 04/08/2008    Years since quitting: 9.5  . Smokeless tobacco: Never Used  Substance and Sexual Activity  . Alcohol use: Yes    Alcohol/week: 4.2 oz    Types: 7 Glasses of wine per week    Comment: Glass wine daily  . Drug use: No  . Sexual activity: Not on file  Lifestyle  . Physical activity:    Days per week: Not on file    Minutes per session: Not on file  . Stress: Not on file  Relationships  . Social connections:    Talks on phone: Not on file    Gets together: Not on file    Attends religious service: Not on file    Active member of club or organization: Not on file    Attends meetings of clubs or  organizations: Not on file    Relationship status: Not on file  . Intimate partner violence:    Fear of current or ex partner: Not on file    Emotionally abused: Not on file    Physically abused: Not on file    Forced sexual activity: Not on file  Other Topics Concern  . Not on file  Social History Narrative   Lives with her husband and their daughter.   Moved [place] from [place] in 1995.    Family History  Problem Relation Age of Onset  . Cancer Maternal Grandmother        Blood problem    ROS: no fevers or chills, productive cough, hemoptysis, dysphasia, odynophagia, melena, hematochezia, dysuria, hematuria, rash, seizure activity, orthopnea, PND, pedal edema, claudication. Remaining systems are negative.  Physical Exam: Well-developed well-nourished in no acute distress.  Skin is warm and dry.  HEENT is normal.  Neck is supple.  Chest is clear to auscultation with normal expansion.  Cardiovascular exam is regular rate and rhythm.  Abdominal exam nontender or distended. No masses  palpated. Extremities show no edema. neuro grossly intact  ECG- personally reviewed  A/P  1  Kirk Ruths, MD

## 2017-10-23 ENCOUNTER — Ambulatory Visit: Payer: No Typology Code available for payment source | Admitting: Cardiology

## 2017-11-07 ENCOUNTER — Encounter (HOSPITAL_COMMUNITY): Payer: Self-pay

## 2017-11-07 ENCOUNTER — Emergency Department (HOSPITAL_COMMUNITY)
Admission: EM | Admit: 2017-11-07 | Discharge: 2017-11-07 | Disposition: A | Discharge status: Home / Self Care | Payer: BLUE CROSS/BLUE SHIELD | Attending: Emergency Medicine | Admitting: Emergency Medicine

## 2017-11-07 ENCOUNTER — Emergency Department (HOSPITAL_COMMUNITY): Payer: BLUE CROSS/BLUE SHIELD

## 2017-11-07 DIAGNOSIS — W0110XA Fall on same level from slipping, tripping and stumbling with subsequent striking against unspecified object, initial encounter: Secondary | ICD-10-CM | POA: Insufficient documentation

## 2017-11-07 DIAGNOSIS — R519 Headache, unspecified: Secondary | ICD-10-CM

## 2017-11-07 DIAGNOSIS — Y999 Unspecified external cause status: Secondary | ICD-10-CM | POA: Diagnosis not present

## 2017-11-07 DIAGNOSIS — Y939 Activity, unspecified: Secondary | ICD-10-CM | POA: Insufficient documentation

## 2017-11-07 DIAGNOSIS — Z87891 Personal history of nicotine dependence: Secondary | ICD-10-CM | POA: Diagnosis not present

## 2017-11-07 DIAGNOSIS — M542 Cervicalgia: Secondary | ICD-10-CM | POA: Diagnosis not present

## 2017-11-07 DIAGNOSIS — S060X1A Concussion with loss of consciousness of 30 minutes or less, initial encounter: Secondary | ICD-10-CM | POA: Diagnosis not present

## 2017-11-07 DIAGNOSIS — R51 Headache: Secondary | ICD-10-CM

## 2017-11-07 DIAGNOSIS — S0990XA Unspecified injury of head, initial encounter: Secondary | ICD-10-CM | POA: Diagnosis not present

## 2017-11-07 DIAGNOSIS — Z79899 Other long term (current) drug therapy: Secondary | ICD-10-CM | POA: Insufficient documentation

## 2017-11-07 DIAGNOSIS — R410 Disorientation, unspecified: Secondary | ICD-10-CM | POA: Diagnosis not present

## 2017-11-07 DIAGNOSIS — S069X9A Unspecified intracranial injury with loss of consciousness of unspecified duration, initial encounter: Secondary | ICD-10-CM | POA: Diagnosis not present

## 2017-11-07 DIAGNOSIS — S299XXA Unspecified injury of thorax, initial encounter: Secondary | ICD-10-CM | POA: Diagnosis not present

## 2017-11-07 DIAGNOSIS — S199XXA Unspecified injury of neck, initial encounter: Secondary | ICD-10-CM | POA: Diagnosis not present

## 2017-11-07 DIAGNOSIS — W19XXXA Unspecified fall, initial encounter: Secondary | ICD-10-CM

## 2017-11-07 DIAGNOSIS — Y92009 Unspecified place in unspecified non-institutional (private) residence as the place of occurrence of the external cause: Secondary | ICD-10-CM | POA: Diagnosis not present

## 2017-11-07 LAB — CBC
HCT: 42.4 % (ref 36.0–46.0)
Hemoglobin: 13.7 g/dL (ref 12.0–15.0)
MCH: 29.4 pg (ref 26.0–34.0)
MCHC: 32.3 g/dL (ref 30.0–36.0)
MCV: 91 fL (ref 78.0–100.0)
PLATELETS: 281 10*3/uL (ref 150–400)
RBC: 4.66 MIL/uL (ref 3.87–5.11)
RDW: 13.3 % (ref 11.5–15.5)
WBC: 8.6 10*3/uL (ref 4.0–10.5)

## 2017-11-07 LAB — RAPID URINE DRUG SCREEN, HOSP PERFORMED
Amphetamines: NOT DETECTED
BARBITURATES: NOT DETECTED
Benzodiazepines: NOT DETECTED
Cocaine: NOT DETECTED
Opiates: NOT DETECTED
TETRAHYDROCANNABINOL: NOT DETECTED

## 2017-11-07 LAB — URINALYSIS, ROUTINE W REFLEX MICROSCOPIC
BILIRUBIN URINE: NEGATIVE
Glucose, UA: NEGATIVE mg/dL
Ketones, ur: NEGATIVE mg/dL
LEUKOCYTES UA: NEGATIVE
NITRITE: NEGATIVE
PH: 5 (ref 5.0–8.0)
Protein, ur: NEGATIVE mg/dL
SPECIFIC GRAVITY, URINE: 1.008 (ref 1.005–1.030)

## 2017-11-07 LAB — COMPREHENSIVE METABOLIC PANEL
ALK PHOS: 59 U/L (ref 38–126)
ALT: 19 U/L (ref 14–54)
AST: 21 U/L (ref 15–41)
Albumin: 4.1 g/dL (ref 3.5–5.0)
Anion gap: 13 (ref 5–15)
BILIRUBIN TOTAL: 0.3 mg/dL (ref 0.3–1.2)
BUN: 14 mg/dL (ref 6–20)
CALCIUM: 9.3 mg/dL (ref 8.9–10.3)
CO2: 22 mmol/L (ref 22–32)
CREATININE: 0.77 mg/dL (ref 0.44–1.00)
Chloride: 104 mmol/L (ref 101–111)
GFR calc Af Amer: >60 mL/min (ref >60)
Glucose, Bld: 121 mg/dL — ABNORMAL HIGH (ref 65–99)
Potassium: 3.2 mmol/L — ABNORMAL LOW (ref 3.5–5.1)
Sodium: 139 mmol/L (ref 135–145)
TOTAL PROTEIN: 6.7 g/dL (ref 6.5–8.1)

## 2017-11-07 LAB — PROTIME-INR
INR: 0.94
Prothrombin Time: 12.5 seconds (ref 11.4–15.2)

## 2017-11-07 LAB — ETHANOL: ALCOHOL ETHYL (B): 83 mg/dL — AB (ref <10)

## 2017-11-07 LAB — I-STAT BETA HCG BLOOD, ED (MC, WL, AP ONLY): I-stat hCG, quantitative: <5 m[IU]/mL (ref <5)

## 2017-11-07 LAB — CBG MONITORING, ED: GLUCOSE-CAPILLARY: 119 mg/dL — AB (ref 65–99)
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
## 2017-11-07 NOTE — ED Notes (Signed)
Patient transported to X-ray 
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
## 2017-11-07 NOTE — ED Notes (Signed)
Main lab to add on PT INR 

## 2017-11-07 NOTE — ED Notes (Signed)
Patient not in room for blood draw. 

## 2017-11-09 LAB — URINE CULTURE

## 2018-01-07 DIAGNOSIS — R42 Dizziness and giddiness: Secondary | ICD-10-CM | POA: Diagnosis not present

## 2018-02-19 DIAGNOSIS — H04123 Dry eye syndrome of bilateral lacrimal glands: Secondary | ICD-10-CM | POA: Diagnosis not present

## 2018-06-10 DIAGNOSIS — Z800 Family history of malignant neoplasm of digestive organs: Secondary | ICD-10-CM | POA: Diagnosis not present

## 2018-06-10 DIAGNOSIS — N912 Amenorrhea, unspecified: Secondary | ICD-10-CM | POA: Diagnosis not present

## 2018-06-10 DIAGNOSIS — Z01419 Encounter for gynecological examination (general) (routine) without abnormal findings: Secondary | ICD-10-CM | POA: Diagnosis not present

## 2018-06-10 DIAGNOSIS — Z803 Family history of malignant neoplasm of breast: Secondary | ICD-10-CM | POA: Diagnosis not present

## 2018-06-10 DIAGNOSIS — Z13228 Encounter for screening for other metabolic disorders: Secondary | ICD-10-CM | POA: Diagnosis not present

## 2018-06-10 DIAGNOSIS — Z1329 Encounter for screening for other suspected endocrine disorder: Secondary | ICD-10-CM | POA: Diagnosis not present

## 2018-06-10 DIAGNOSIS — Z13220 Encounter for screening for lipoid disorders: Secondary | ICD-10-CM | POA: Diagnosis not present

## 2018-06-10 DIAGNOSIS — Z6827 Body mass index (BMI) 27.0-27.9, adult: Secondary | ICD-10-CM | POA: Diagnosis not present

## 2018-06-10 DIAGNOSIS — Z1231 Encounter for screening mammogram for malignant neoplasm of breast: Secondary | ICD-10-CM | POA: Diagnosis not present

## 2018-06-10 DIAGNOSIS — Z1321 Encounter for screening for nutritional disorder: Secondary | ICD-10-CM | POA: Diagnosis not present

## 2018-06-29 DIAGNOSIS — Z3202 Encounter for pregnancy test, result negative: Secondary | ICD-10-CM | POA: Diagnosis not present

## 2018-06-29 DIAGNOSIS — Z3046 Encounter for surveillance of implantable subdermal contraceptive: Secondary | ICD-10-CM | POA: Diagnosis not present

## 2018-07-22 DIAGNOSIS — F419 Anxiety disorder, unspecified: Secondary | ICD-10-CM | POA: Diagnosis not present

## 2018-07-22 DIAGNOSIS — Z803 Family history of malignant neoplasm of breast: Secondary | ICD-10-CM | POA: Diagnosis not present

## 2018-10-03 ENCOUNTER — Emergency Department (HOSPITAL_COMMUNITY): Payer: BLUE CROSS/BLUE SHIELD

## 2018-10-03 ENCOUNTER — Emergency Department (HOSPITAL_COMMUNITY)
Admission: EM | Admit: 2018-10-03 | Discharge: 2018-10-03 | Disposition: A | Discharge status: Home / Self Care | Payer: BLUE CROSS/BLUE SHIELD | Attending: Emergency Medicine | Admitting: Emergency Medicine

## 2018-10-03 ENCOUNTER — Other Ambulatory Visit: Payer: Self-pay

## 2018-10-03 ENCOUNTER — Encounter (HOSPITAL_COMMUNITY): Payer: Self-pay

## 2018-10-03 DIAGNOSIS — Z87891 Personal history of nicotine dependence: Secondary | ICD-10-CM | POA: Diagnosis not present

## 2018-10-03 DIAGNOSIS — R197 Diarrhea, unspecified: Secondary | ICD-10-CM | POA: Diagnosis not present

## 2018-10-03 DIAGNOSIS — R1031 Right lower quadrant pain: Secondary | ICD-10-CM | POA: Insufficient documentation

## 2018-10-03 DIAGNOSIS — R1011 Right upper quadrant pain: Secondary | ICD-10-CM | POA: Diagnosis not present

## 2018-10-03 DIAGNOSIS — R5383 Other fatigue: Secondary | ICD-10-CM | POA: Diagnosis not present

## 2018-10-03 DIAGNOSIS — K529 Noninfective gastroenteritis and colitis, unspecified: Secondary | ICD-10-CM | POA: Insufficient documentation

## 2018-10-03 DIAGNOSIS — R110 Nausea: Secondary | ICD-10-CM | POA: Insufficient documentation

## 2018-10-03 LAB — COMPREHENSIVE METABOLIC PANEL
ALT: 22 U/L (ref 0–44)
AST: 21 U/L (ref 15–41)
Albumin: 4.2 g/dL (ref 3.5–5.0)
Alkaline Phosphatase: 59 U/L (ref 38–126)
Anion gap: 7 (ref 5–15)
BUN: 8 mg/dL (ref 6–20)
CO2: 26 mmol/L (ref 22–32)
Calcium: 9.5 mg/dL (ref 8.9–10.3)
Chloride: 105 mmol/L (ref 98–111)
Creatinine, Ser: 0.76 mg/dL (ref 0.44–1.00)
GFR calc Af Amer: >60 mL/min (ref >60)
GFR calc non Af Amer: >60 mL/min (ref >60)
Glucose, Bld: 97 mg/dL (ref 70–99)
Potassium: 3.9 mmol/L (ref 3.5–5.1)
Sodium: 138 mmol/L (ref 135–145)
Total Bilirubin: 0.6 mg/dL (ref 0.3–1.2)
Total Protein: 7 g/dL (ref 6.5–8.1)

## 2018-10-03 LAB — PREGNANCY, URINE: Preg Test, Ur: NEGATIVE

## 2018-10-03 LAB — CBC WITH DIFFERENTIAL/PLATELET
Abs Immature Granulocytes: 0.03 10*3/uL (ref 0.00–0.07)
Basophils Absolute: 0 10*3/uL (ref 0.0–0.1)
Basophils Relative: 0 %
Eosinophils Absolute: 0.1 10*3/uL (ref 0.0–0.5)
Eosinophils Relative: 1 %
HCT: 44.5 % (ref 36.0–46.0)
Hemoglobin: 14.3 g/dL (ref 12.0–15.0)
Immature Granulocytes: 0 %
Lymphocytes Relative: 20 %
Lymphs Abs: 1.9 10*3/uL (ref 0.7–4.0)
MCH: 28.8 pg (ref 26.0–34.0)
MCHC: 32.1 g/dL (ref 30.0–36.0)
MCV: 89.5 fL (ref 80.0–100.0)
Monocytes Absolute: 0.6 10*3/uL (ref 0.1–1.0)
Monocytes Relative: 7 %
Neutro Abs: 6.5 10*3/uL (ref 1.7–7.7)
Neutrophils Relative %: 72 %
Platelets: 248 10*3/uL (ref 150–400)
RBC: 4.97 MIL/uL (ref 3.87–5.11)
RDW: 13.8 % (ref 11.5–15.5)
WBC: 9.2 10*3/uL (ref 4.0–10.5)
nRBC: 0 % (ref 0.0–0.2)

## 2018-10-03 LAB — URINALYSIS, ROUTINE W REFLEX MICROSCOPIC
Bilirubin Urine: NEGATIVE
Glucose, UA: NEGATIVE mg/dL
Hgb urine dipstick: NEGATIVE
Ketones, ur: NEGATIVE mg/dL
Leukocytes,Ua: NEGATIVE
Nitrite: NEGATIVE
Protein, ur: NEGATIVE mg/dL
Specific Gravity, Urine: 1.005 (ref 1.005–1.030)
pH: 6 (ref 5.0–8.0)

## 2018-10-03 LAB — LIPASE, BLOOD: Lipase: 29 U/L (ref 11–51)

## 2018-10-03 MED ORDER — METRONIDAZOLE 500 MG PO TABS
500.0000 mg | ORAL_TABLET | Freq: Once | ORAL | Status: AC
  Administered 2018-10-03: 500 mg via ORAL
  Filled 2018-10-03: qty 1

## 2018-10-03 MED ORDER — IOHEXOL 300 MG/ML  SOLN
100.0000 mL | Freq: Once | INTRAMUSCULAR | Status: AC | PRN
  Administered 2018-10-03: 100 mL via INTRAVENOUS

## 2018-10-03 MED ORDER — SODIUM CHLORIDE 0.9 % IV BOLUS
1000.0000 mL | Freq: Once | INTRAVENOUS | Status: AC
  Administered 2018-10-03: 16:00:00 1000 mL via INTRAVENOUS

## 2018-10-03 MED ORDER — CIPROFLOXACIN HCL 500 MG PO TABS
500.0000 mg | ORAL_TABLET | Freq: Once | ORAL | Status: AC
  Administered 2018-10-03: 20:00:00 500 mg via ORAL
  Filled 2018-10-03: qty 1

## 2018-10-03 MED ORDER — HYDROCODONE-ACETAMINOPHEN 5-325 MG PO TABS: 1.0000 | ORAL_TABLET | Freq: Four times a day (QID) | ORAL | 0 refills | Status: DC | PRN

## 2018-10-03 MED ORDER — CIPROFLOXACIN HCL 500 MG PO TABS: 500.0000 mg | ORAL_TABLET | Freq: Two times a day (BID) | ORAL | 0 refills | Status: DC

## 2018-10-03 MED ORDER — METRONIDAZOLE 500 MG PO TABS: 500.0000 mg | ORAL_TABLET | Freq: Two times a day (BID) | ORAL | 0 refills | Status: DC

## 2018-10-03 MED ORDER — HYDROCODONE-ACETAMINOPHEN 5-325 MG PO TABS
2.0000 | ORAL_TABLET | Freq: Once | ORAL | Status: AC
  Administered 2018-10-03: 20:00:00 2 via ORAL
  Filled 2018-10-03: qty 2

## 2018-10-03 NOTE — ED Notes (Signed)
Patient transported to CT 

## 2018-10-03 NOTE — ED Triage Notes (Signed)
Pt reports pain in the upper right hand side for 2x weeks. Diarrhea reported today. General body aches.

## 2018-10-03 NOTE — Discharge Instructions (Addendum)
Seen in the emergency department for abdominal pain and diarrhea.  Your lab work was unremarkable although your CAT scan showed some bowel thickening consistent with an enteritis.  We are prescribing you 2 antibiotics to finish.  Please stay well-hydrated.  We are also sending you home with a prescription for some pain medicine.  Please contact your GI doctor for outpatient follow-up.  Return if any worsening symptoms.

## 2018-10-03 NOTE — ED Provider Notes (Signed)
[facility] EMERGENCY DEPARTMENT Provider Note   CSN: [id number] Arrival date & time: 10/03/18  1538    History   Chief Complaint Chief Complaint  Patient presents with   Diarrhea    HPI Carol Martinez is a 53 y.o. female.  She is complaining of 2 weeks of on and off right upper quadrant pain sometimes severe in nature.  It was also associate with some right lower quadrant pain over the last few days.  Starting last night she had copious diarrhea multiple episodes green in nature.  She feels generally kind of weak all over and just not well.  No known fever but she has not taken her temperature.  She has had chills at times.  No sick contacts or recent travel.  No cough runny nose sore throat chest pain.  No urinary symptoms.  She is perimenopausal and infrequently has menses.  No vaginal discharge or bleeding.  No rashes.     The history is provided by the patient.  Abdominal Pain  Pain location:  RUQ and RLQ Pain quality: aching and cramping   Pain radiates to:  Does not radiate Pain severity:  Severe Onset quality:  Gradual Timing:  Intermittent Progression:  Unchanged Chronicity:  New Context: not recent illness, not recent travel, not sick contacts and not trauma   Relieved by:  Nothing Worsened by:  Nothing Ineffective treatments:  None tried Associated symptoms: chills, diarrhea, fatigue and nausea   Associated symptoms: no chest pain, no cough, no dysuria, no fever, no hematemesis, no hematochezia, no hematuria, no shortness of breath, no sore throat, no vaginal bleeding, no vaginal discharge and no vomiting     Past Medical History:  Diagnosis Date   Allergy    Anxiety    Bruising 09/08/2013   Cataract    Depression    GERD (gastroesophageal reflux disease)    Heart murmur    Post-partum depression     Patient Active Problem List   Diagnosis Date Noted   Post-partum depression    Heart murmur    GERD (gastroesophageal reflux  disease)    Depression    Cataract    Allergy    Anxiety    Bruising 09/08/2013    Past Surgical History:  Procedure Laterality Date   APPENDECTOMY     EYE SURGERY     TONSILLECTOMY       OB History   No obstetric history on file.      Home Medications    Prior to Admission medications   Medication Sig Start Date End Date Taking? Authorizing Provider  polyvinyl alcohol (ARTIFICIAL TEARS) 1.4 % ophthalmic solution Place 1 drop into both eyes daily as needed for dry eyes.    [provider]  traMADol (ULTRAM) 50 MG tablet Take 50 mg by mouth daily as needed (pain).  08/20/17   [provider]    Family History Family History  Problem Relation Age of Onset   Cancer Maternal Grandmother        Blood problem    Social History Social History   Tobacco Use   Smoking status: Former Smoker    Packs/day: 1.00    Years: 27.00    Pack years: 27.00    Start date: 06/30/1980    Last attempt to quit: 04/08/2008    Years since quitting: 10.4   Smokeless tobacco: Never Used  Substance Use Topics   Alcohol use: Yes    Alcohol/week: 7.0 standard drinks  Types: 7 Glasses of wine per week    Comment: Glass wine daily   Drug use: No     Allergies   Phenergan [promethazine hcl]; Aspirin; and Penicillins   Review of Systems Review of Systems  Constitutional: Positive for chills and fatigue. Negative for fever.  HENT: Negative for sore throat.   Eyes: Negative for visual disturbance.  Respiratory: Negative for cough and shortness of breath.   Cardiovascular: Negative for chest pain.  Gastrointestinal: Positive for abdominal pain, diarrhea and nausea. Negative for hematemesis, hematochezia and vomiting.  Genitourinary: Negative for dysuria, hematuria, vaginal bleeding and vaginal discharge.  Musculoskeletal: Negative for neck pain.  Skin: Negative for rash.  Neurological: Negative for headaches.     Physical Exam Updated Vital Signs BP  (!) 166/88 (BP Location: Right Arm)    Pulse 81    Temp 98.2 F (36.8 C) (Oral)    Resp 18    SpO2 100%   Physical Exam Vitals signs and nursing note reviewed.  Constitutional:      General: She is not in acute distress.    Appearance: She is well-developed.  HENT:     Head: Normocephalic and atraumatic.  Eyes:     Conjunctiva/sclera: Conjunctivae normal.  Neck:     Musculoskeletal: Neck supple.  Cardiovascular:     Rate and Rhythm: Normal rate and regular rhythm.     Heart sounds: No murmur.  Pulmonary:     Effort: Pulmonary effort is normal. No respiratory distress.     Breath sounds: Normal breath sounds.  Abdominal:     Palpations: Abdomen is soft.     Tenderness: There is no abdominal tenderness.  Musculoskeletal: Normal range of motion.        General: No tenderness or signs of injury.     Right lower leg: No edema.     Left lower leg: No edema.  Skin:    General: Skin is warm and dry.     Capillary Refill: Capillary refill takes less than 2 seconds.  Neurological:     General: No focal deficit present.     Mental Status: She is alert and oriented to person, place, and time.     Gait: Gait normal.      ED Treatments / Results  Labs (all labs ordered are listed, but only abnormal results are displayed) Labs Reviewed  URINALYSIS, ROUTINE W REFLEX MICROSCOPIC - Abnormal; Notable for the following components:      Result Value   Color, Urine STRAW (*)    All other components within normal limits  COMPREHENSIVE METABOLIC PANEL  LIPASE, BLOOD  CBC WITH DIFFERENTIAL/PLATELET  PREGNANCY, URINE    EKG None  Radiology Ct Abdomen Pelvis W Contrast  Result Date: 10/03/2018 CLINICAL DATA:  Right upper quadrant pain 2 weeks with diarrhea today. Generalized body aches. Previous appendectomy. EXAM: CT ABDOMEN AND PELVIS WITH CONTRAST TECHNIQUE: Multidetector CT imaging of the abdomen and pelvis was performed using the standard protocol following bolus administration of  intravenous contrast. CONTRAST:  OMNIPAQUE IOHEXOL 300 MG/ML  SOLN COMPARISON:  None. FINDINGS: Lower chest: Lung bases are normal. Hepatobiliary: Gallbladder is contracted. Liver and biliary tree are normal. Pancreas: Normal. Spleen: Normal. Adrenals/Urinary Tract: Adrenal glands are normal. Kidneys are normal in size without hydronephrosis. 3 mm nonobstructing stone over the lower pole left kidney. Ureters and bladder are normal. Stomach/Bowel: Stomach is normal. Small bowel is normal in caliber as there is mild wall thickening over several jejunal loops over the  left mid abdomen which is nonspecific as could be seen with regional enteritis of infectious or inflammatory nature. Previous appendectomy. Colon is within normal. Vascular/Lymphatic: Normal. Reproductive: Normal. Other: Mild free fluid over the pelvis likely physiologic. No focal inflammatory change. Small umbilical hernia containing only peritoneal fat. Musculoskeletal: Mild degenerative change of the lumbar spine with multilevel disc disease worse at the L4-5 level. Mild degenerative change of the hips. IMPRESSION: Nonspecific mild wall thickening over several jejunal loops in the left mid abdomen which could be seen in a regional enteritis of infectious or inflammatory nature. Otherwise, no acute findings in the abdomen/pelvis. Nonobstructing 3 mm stone over the lower pole left kidney. Small umbilical hernia containing only peritoneal fat. Electronically Signed   By: Elberta Fortis M.D.   On: 10/03/2018 18:56    Procedures Procedures (including critical care time)  Medications Ordered in ED Medications  sodium chloride 0.9 % bolus 1,000 mL (0 mLs Intravenous Stopped 10/03/18 2008)  iohexol (OMNIPAQUE) 300 MG/ML solution 100 mL (100 mLs Intravenous Contrast Given 10/03/18 1834)  HYDROcodone-acetaminophen (NORCO/VICODIN) 5-325 MG per tablet 2 tablet (2 tablets Oral Given 10/03/18 2012)  ciprofloxacin (CIPRO) tablet 500 mg (500 mg Oral Given  10/03/18 2012)  metroNIDAZOLE (FLAGYL) tablet 500 mg (500 mg Oral Given 10/03/18 2012)     Initial Impression / Assessment and Plan / ED Course  I have reviewed the triage vital signs and the nursing notes.  Pertinent labs & imaging results that were available during my care of the patient were reviewed by me and considered in my medical decision making (see chart for details).  Clinical Course as of Oct 03 1630  Sun Oct 03, 2018  1556 Differential diagnosis includes biliary colic, a GE, irritable bowel, colitis, renal colic.  She has a fairly benign exam and unremarkable vital signs other than some hypertension.  Getting some screening labs urinalysis and likely will need imaging.   [MB]    Clinical Course User Index [MB] Terrilee Files, MD   Durel Salts was evaluated in Emergency Department on 10/03/2018 for the symptoms described in the history of present illness. She was evaluated in the context of the global COVID-19 pandemic, which necessitated consideration that the patient might be at risk for infection with the SARS-CoV-2 virus that causes COVID-19. Institutional protocols and algorithms that pertain to the evaluation of patients at risk for COVID-19 are in a state of rapid change based on information released by regulatory bodies including the CDC and federal and state organizations. These policies and algorithms were followed during the patient's care in the ED.      Final Clinical Impressions(s) / ED Diagnoses   Final diagnoses:  Enteritis  Diarrhea, unspecified type    ED Discharge Orders         Ordered    ciprofloxacin (CIPRO) 500 MG tablet  2 times daily     10/03/18 2003    metroNIDAZOLE (FLAGYL) 500 MG tablet  2 times daily     10/03/18 2003    HYDROcodone-acetaminophen (NORCO/VICODIN) 5-325 MG tablet  Every 6 hours PRN     10/03/18 2003           Terrilee Files, MD 10/04/18 1034

## 2018-11-01 DIAGNOSIS — R319 Hematuria, unspecified: Secondary | ICD-10-CM | POA: Diagnosis not present

## 2018-11-01 DIAGNOSIS — E559 Vitamin D deficiency, unspecified: Secondary | ICD-10-CM | POA: Diagnosis not present

## 2018-11-01 DIAGNOSIS — N200 Calculus of kidney: Secondary | ICD-10-CM | POA: Diagnosis not present

## 2018-11-01 DIAGNOSIS — R109 Unspecified abdominal pain: Secondary | ICD-10-CM | POA: Diagnosis not present

## 2018-11-15 DIAGNOSIS — K6389 Other specified diseases of intestine: Secondary | ICD-10-CM | POA: Diagnosis not present

## 2018-11-15 DIAGNOSIS — R197 Diarrhea, unspecified: Secondary | ICD-10-CM | POA: Diagnosis not present

## 2018-11-15 DIAGNOSIS — K625 Hemorrhage of anus and rectum: Secondary | ICD-10-CM | POA: Diagnosis not present

## 2018-11-18 DIAGNOSIS — N200 Calculus of kidney: Secondary | ICD-10-CM | POA: Insufficient documentation

## 2018-11-18 DIAGNOSIS — R197 Diarrhea, unspecified: Secondary | ICD-10-CM | POA: Diagnosis not present

## 2018-11-18 DIAGNOSIS — Z87448 Personal history of other diseases of urinary system: Secondary | ICD-10-CM | POA: Diagnosis not present

## 2018-11-18 DIAGNOSIS — R319 Hematuria, unspecified: Secondary | ICD-10-CM | POA: Insufficient documentation

## 2018-11-18 DIAGNOSIS — Z87442 Personal history of urinary calculi: Secondary | ICD-10-CM | POA: Diagnosis not present

## 2018-11-22 DIAGNOSIS — Z86010 Personal history of colonic polyps: Secondary | ICD-10-CM | POA: Insufficient documentation

## 2018-12-06 DIAGNOSIS — A0472 Enterocolitis due to Clostridium difficile, not specified as recurrent: Secondary | ICD-10-CM | POA: Diagnosis not present

## 2019-01-11 DIAGNOSIS — A0472 Enterocolitis due to Clostridium difficile, not specified as recurrent: Secondary | ICD-10-CM | POA: Diagnosis not present

## 2019-03-15 DIAGNOSIS — N200 Calculus of kidney: Secondary | ICD-10-CM | POA: Diagnosis not present

## 2019-03-15 DIAGNOSIS — R319 Hematuria, unspecified: Secondary | ICD-10-CM | POA: Diagnosis not present

## 2019-06-06 DIAGNOSIS — Z13220 Encounter for screening for lipoid disorders: Secondary | ICD-10-CM | POA: Diagnosis not present

## 2019-06-06 DIAGNOSIS — Z0000 Encounter for general adult medical examination without abnormal findings: Secondary | ICD-10-CM | POA: Diagnosis not present

## 2019-06-15 ENCOUNTER — Emergency Department (HOSPITAL_COMMUNITY)
Admission: EM | Admit: 2019-06-15 | Discharge: 2019-06-15 | Disposition: A | Discharge status: Home / Self Care | Payer: BC Managed Care – PPO | Attending: Emergency Medicine | Admitting: Emergency Medicine

## 2019-06-15 ENCOUNTER — Encounter (HOSPITAL_COMMUNITY): Payer: Self-pay | Admitting: Emergency Medicine

## 2019-06-15 DIAGNOSIS — E860 Dehydration: Secondary | ICD-10-CM | POA: Insufficient documentation

## 2019-06-15 DIAGNOSIS — U071 COVID-19: Secondary | ICD-10-CM | POA: Diagnosis not present

## 2019-06-15 DIAGNOSIS — Z20828 Contact with and (suspected) exposure to other viral communicable diseases: Secondary | ICD-10-CM | POA: Diagnosis not present

## 2019-06-15 DIAGNOSIS — Z87891 Personal history of nicotine dependence: Secondary | ICD-10-CM | POA: Diagnosis not present

## 2019-06-15 DIAGNOSIS — Z20822 Contact with and (suspected) exposure to covid-19: Secondary | ICD-10-CM

## 2019-06-15 DIAGNOSIS — R197 Diarrhea, unspecified: Secondary | ICD-10-CM | POA: Diagnosis not present

## 2019-06-15 DIAGNOSIS — Z91040 Latex allergy status: Secondary | ICD-10-CM | POA: Diagnosis not present

## 2019-06-15 DIAGNOSIS — R531 Weakness: Secondary | ICD-10-CM | POA: Diagnosis not present

## 2019-06-15 DIAGNOSIS — Z79899 Other long term (current) drug therapy: Secondary | ICD-10-CM | POA: Insufficient documentation

## 2019-06-15 LAB — COMPREHENSIVE METABOLIC PANEL
ALT: 57 U/L — ABNORMAL HIGH (ref 0–44)
AST: 38 U/L (ref 15–41)
Albumin: 3.5 g/dL (ref 3.5–5.0)
Alkaline Phosphatase: 45 U/L (ref 38–126)
Anion gap: 14 (ref 5–15)
BUN: 6 mg/dL (ref 6–20)
CO2: 21 mmol/L — ABNORMAL LOW (ref 22–32)
Calcium: 9.2 mg/dL (ref 8.9–10.3)
Chloride: 102 mmol/L (ref 98–111)
Creatinine, Ser: 0.63 mg/dL (ref 0.44–1.00)
GFR calc Af Amer: >60 mL/min (ref >60)
GFR calc non Af Amer: >60 mL/min (ref >60)
Glucose, Bld: 91 mg/dL (ref 70–99)
Potassium: 4 mmol/L (ref 3.5–5.1)
Sodium: 137 mmol/L (ref 135–145)
Total Bilirubin: 0.6 mg/dL (ref 0.3–1.2)
Total Protein: 6.9 g/dL (ref 6.5–8.1)

## 2019-06-15 LAB — CBC WITH DIFFERENTIAL/PLATELET
Abs Immature Granulocytes: 0.04 10*3/uL (ref 0.00–0.07)
Basophils Absolute: 0 10*3/uL (ref 0.0–0.1)
Basophils Relative: 0 %
Eosinophils Absolute: 0 10*3/uL (ref 0.0–0.5)
Eosinophils Relative: 0 %
HCT: 47 % — ABNORMAL HIGH (ref 36.0–46.0)
Hemoglobin: 15.3 g/dL — ABNORMAL HIGH (ref 12.0–15.0)
Immature Granulocytes: 1 %
Lymphocytes Relative: 15 %
Lymphs Abs: 1.1 10*3/uL (ref 0.7–4.0)
MCH: 28.9 pg (ref 26.0–34.0)
MCHC: 32.6 g/dL (ref 30.0–36.0)
MCV: 88.8 fL (ref 80.0–100.0)
Monocytes Absolute: 0.8 10*3/uL (ref 0.1–1.0)
Monocytes Relative: 10 %
Neutro Abs: 5.8 10*3/uL (ref 1.7–7.7)
Neutrophils Relative %: 74 %
Platelets: 252 10*3/uL (ref 150–400)
RBC: 5.29 MIL/uL — ABNORMAL HIGH (ref 3.87–5.11)
RDW: 12.9 % (ref 11.5–15.5)
WBC: 7.8 10*3/uL (ref 4.0–10.5)
nRBC: 0 % (ref 0.0–0.2)

## 2019-06-15 LAB — URINALYSIS, ROUTINE W REFLEX MICROSCOPIC
Bilirubin Urine: NEGATIVE
Glucose, UA: NEGATIVE mg/dL
Hgb urine dipstick: NEGATIVE
Ketones, ur: 20 mg/dL — AB
Leukocytes,Ua: NEGATIVE
Nitrite: NEGATIVE
Protein, ur: 30 mg/dL — AB
Specific Gravity, Urine: 1.023 (ref 1.005–1.030)
pH: 6 (ref 5.0–8.0)

## 2019-06-15 LAB — POC SARS CORONAVIRUS 2 AG -  ED: SARS Coronavirus 2 Ag: NEGATIVE

## 2019-06-15 LAB — SARS CORONAVIRUS 2 (TAT 6-24 HRS): SARS Coronavirus 2: POSITIVE — AB

## 2019-06-15 MED ORDER — LORAZEPAM 2 MG/ML IJ SOLN
1.0000 mg | Freq: Once | INTRAMUSCULAR | Status: AC
  Administered 2019-06-15: 15:00:00 1 mg via INTRAVENOUS
  Filled 2019-06-15: qty 1

## 2019-06-15 MED ORDER — DIPHENHYDRAMINE HCL 50 MG/ML IJ SOLN
25.0000 mg | Freq: Once | INTRAMUSCULAR | Status: AC
  Administered 2019-06-15: 25 mg via INTRAVENOUS
  Filled 2019-06-15: qty 1

## 2019-06-15 MED ORDER — ONDANSETRON HCL 4 MG PO TABS: 4.0000 mg | ORAL_TABLET | Freq: Four times a day (QID) | ORAL | 0 refills | Status: DC

## 2019-06-15 MED ORDER — METOCLOPRAMIDE HCL 5 MG/ML IJ SOLN
10.0000 mg | Freq: Once | INTRAMUSCULAR | Status: AC
  Administered 2019-06-15: 15:00:00 10 mg via INTRAVENOUS
  Filled 2019-06-15: qty 2

## 2019-06-15 MED ORDER — DIPHENHYDRAMINE HCL 50 MG/ML IJ SOLN
25.0000 mg | Freq: Once | INTRAMUSCULAR | Status: AC
  Administered 2019-06-15: 25 mg via INTRAVENOUS

## 2019-06-15 MED ORDER — SODIUM CHLORIDE 0.9 % IV BOLUS
1000.0000 mL | Freq: Once | INTRAVENOUS | Status: AC
  Administered 2019-06-15: 15:00:00 1000 mL via INTRAVENOUS

## 2019-06-15 NOTE — ED Triage Notes (Signed)
Pt here from home with c/o h/a along with not feeling well , pt states that she has can not taste anything or smell anything

## 2019-06-15 NOTE — ED Provider Notes (Addendum)
[facility] EMERGENCY DEPARTMENT Provider Note   CSN: [id number] Arrival date & time: 06/15/19  1129     History Chief Complaint  Patient presents with  . Weakness    Carol Martinez is a 53 y.o. female past medical history significant for anxiety, depression, GERD, ulcerative colitis, c difficile  presents to emergency department today with chief complaint of generalized weakness and nonproductive cough x5 days.  She is concerned she has Covid.  She is also reporting headache.  She states headache has progressively worsened since onset.  Pain is located throughout her entire head.  It feels like headaches she has had in the past.  She admits to loss of senses of taste and smell.  She admits to decreased p.o. intake because she has lack of appetite and nausea.  Denies emesis.  Also has had multiple episodes of nonbloody diarrhea.  She states she has had too many episodes to count in the last 24 hours.  She states every bowel movement she has is diarrhea.  She has intermittent abdominal cramping in bilateral lower quadrants.  Denies any blood in her stool. She states she has been around her daughter who is positive for Covid.  She has been taking Tylenol without symptom relief. She denies fever, chills, congestion, sore throat, neck pain, visual changes, chest pain, urinary symptoms, rash.  Abdominal surgical history includes appendectomy.  History provided by patient with additional history obtained from chart review.     Past Medical History:  Diagnosis Date  . Allergy   . Anxiety   . Bruising 09/08/2013  . Cataract   . Depression   . GERD (gastroesophageal reflux disease)   . Heart murmur   . Post-partum depression     Patient Active Problem List   Diagnosis Date Noted  . Post-partum depression   . Heart murmur   . GERD (gastroesophageal reflux disease)   . Depression   . Cataract   . Allergy   . Anxiety   . Bruising 09/08/2013    Past Surgical History:   Procedure Laterality Date  . APPENDECTOMY    . EYE SURGERY    . TONSILLECTOMY       OB History   No obstetric history on file.     Family History  Problem Relation Age of Onset  . Cancer Maternal Grandmother        Blood problem    Social History   Tobacco Use  . Smoking status: Former Smoker    Packs/day: 1.00    Years: 27.00    Pack years: 27.00    Start date: 06/30/1980    Quit date: 04/08/2008    Years since quitting: 11.1  . Smokeless tobacco: Never Used  Substance Use Topics  . Alcohol use: Yes    Alcohol/week: 7.0 standard drinks    Types: 7 Glasses of wine per week    Comment: Glass wine daily  . Drug use: No    Home Medications Prior to Admission medications   Medication Sig Start Date End Date Taking? Authorizing Provider  acetaminophen (TYLENOL) 325 MG tablet Take 325-650 mg by mouth every 6 (six) hours as needed for mild pain or headache.     [provider]  ciprofloxacin (CIPRO) 500 MG tablet Take 1 tablet (500 mg total) by mouth 2 (two) times daily. 10/03/18   Hayden Rasmussen, MD  HYDROcodone-acetaminophen (NORCO/VICODIN) 5-325 MG tablet Take 1-2 tablets by mouth every 6 (six) hours as needed for severe  pain. 10/03/18   Terrilee Files, MD  metroNIDAZOLE (FLAGYL) 500 MG tablet Take 1 tablet (500 mg total) by mouth 2 (two) times daily. 10/03/18   Terrilee Files, MD  NON FORMULARY Place 1-2 sprays under the tongue See admin instructions. Colloidal Silver spray: Spray 1-2 times under the tongue as needed to "boost" the immune system    [provider]  ondansetron (ZOFRAN) 4 MG tablet Take 1 tablet (4 mg total) by mouth every 6 (six) hours. 06/15/19   Law, Waylan Boga, PA-C  polyvinyl alcohol (ARTIFICIAL TEARS) 1.4 % ophthalmic solution Place 1 drop into both eyes as needed for dry eyes.     [provider]    Allergies    Phenergan [promethazine hcl], Aspirin, Penicillins, and Latex  Review of Systems   Review of Systems    All other systems are reviewed and are negative for acute change except as noted in the HPI.   Physical Exam Updated Vital Signs BP 119/79   Pulse 80   Temp 99.1 F (37.3 C)   Resp 20   SpO2 94%   Physical Exam Vitals and nursing note reviewed.  Constitutional:      General: She is not in acute distress.    Appearance: She is not toxic-appearing.  HENT:     Head: Normocephalic and atraumatic.     Comments: No sinus or temporal tenderness.    Right Ear: Tympanic membrane and external ear normal.     Left Ear: Tympanic membrane and external ear normal.     Nose: Nose normal.     Mouth/Throat:     Mouth: Mucous membranes are dry.     Pharynx: Oropharynx is clear.  Eyes:     General: No scleral icterus.       Right eye: No discharge.        Left eye: No discharge.     Extraocular Movements: Extraocular movements intact.     Conjunctiva/sclera: Conjunctivae normal.     Pupils: Pupils are equal, round, and reactive to light.  Neck:     Vascular: No JVD.     Comments: No meningeal signs Cardiovascular:     Rate and Rhythm: Normal rate and regular rhythm.     Pulses: Normal pulses.          Radial pulses are 2+ on the right side and 2+ on the left side.     Heart sounds: Normal heart sounds.  Pulmonary:     Comments: Lungs clear to auscultation in all fields. Symmetric chest rise. No wheezing, rales, or rhonchi.  SPO2 is 96% on room air.  She is speaking in full sentences. Abdominal:     Tenderness: There is no right CVA tenderness or left CVA tenderness.     Comments: Abdomen is soft, non-distended, generalized abdominal tenderness.  No rigidity, no guarding. No peritoneal signs.  Normoactive bowel sounds.  Musculoskeletal:        General: Normal range of motion.     Cervical back: Normal range of motion.  Skin:    General: Skin is warm and dry.     Capillary Refill: Capillary refill takes less than 2 seconds.     Findings: No rash.  Neurological:     Mental Status:  She is oriented to person, place, and time.     GCS: GCS eye subscore is 4. GCS verbal subscore is 5. GCS motor subscore is 6.     Comments: Speech is clear and goal  oriented, follows commands CN III-XII intact, no facial droop Normal strength in upper and lower extremities bilaterally including dorsiflexion and plantar flexion, strong and equal grip strength Sensation normal to light and sharp touch Moves extremities without ataxia, coordination intact Normal finger to nose and rapid alternating movements Normal gait and balance   Psychiatric:        Behavior: Behavior normal.       ED Results / Procedures / Treatments   Labs (all labs ordered are listed, but only abnormal results are displayed) Labs Reviewed  SARS CORONAVIRUS 2 (TAT 6-24 HRS) - Abnormal; Notable for the following components:      Result Value   SARS Coronavirus 2 POSITIVE (*)    All other components within normal limits  COMPREHENSIVE METABOLIC PANEL - Abnormal; Notable for the following components:   CO2 21 (*)    ALT 57 (*)    All other components within normal limits  URINALYSIS, ROUTINE W REFLEX MICROSCOPIC - Abnormal; Notable for the following components:   APPearance HAZY (*)    Ketones, ur 20 (*)    Protein, ur 30 (*)    Bacteria, UA FEW (*)    All other components within normal limits  CBC WITH DIFFERENTIAL/PLATELET - Abnormal; Notable for the following components:   RBC 5.29 (*)    Hemoglobin 15.3 (*)    HCT 47.0 (*)    All other components within normal limits  POC SARS CORONAVIRUS 2 AG -  ED    EKG None  Radiology No results found.  Procedures Procedures (including critical care time)  Medications Ordered in ED Medications  metoCLOPramide (REGLAN) injection 10 mg (10 mg Intravenous Given 06/15/19 1450)  diphenhydrAMINE (BENADRYL) injection 25 mg (25 mg Intravenous Given 06/15/19 1450)  sodium chloride 0.9 % bolus 1,000 mL (0 mLs Intravenous Stopped 06/15/19 1700)  diphenhydrAMINE  (BENADRYL) injection 25 mg (25 mg Intravenous Given 06/15/19 1511)  LORazepam (ATIVAN) injection 1 mg (1 mg Intravenous Given 06/15/19 1511)    ED Course  I have reviewed the triage vital signs and the nursing notes.  Pertinent labs & imaging results that were available during my care of the patient were reviewed by me and considered in my medical decision making (see chart for details).  Clinical Course as of Jun 15 1904  Wed Jun 15, 2019  1538 Rapid Covid test is negative, will send 6 to 24-hour test.  SARS Coronavirus 2 Ag: NEGATIVE [KA]  1538 CBC without leukocytosis, no anemia.  Suspect dehydration as CBC is hemoconcentrated.  WBC: 7.8 [KA]    Clinical Course User Index [KA] Aking Klabunde, Caroleen Hamman, PA-C   MDM Rules/Calculators/A&P                       Patient seen and examined.  She looks to not feel well however is nontoxic in appearance.  On arrival she has low-grade temp of 99.1.  No hypoxia or tachycardia.  My exam her lungs are clear to auscultation all fields.  She has normal work of breathing and SPO2 during my exam is 96% on room air.  Abdomen has generalized tenderness without peritoneal signs.  Neuro exam is normal, no focal deficit. With her lack of p.o. intake over the last 5 days as well as multiple episodes of diarrhea will check basic labs and perform Covid testing.  Patient will be given IV fluids,  IV Reglan and  IV Benadryl for headache.  Presentation is like her typical headache  and is not concerning [facility], [facility], meningitis or temporal arteritis, she has no focal neuro deficits, nuchal rigidity or changes in her vision. Plan to repeat abdominal exam after pain control and IVF.   After receiving Reglan patient unfortunately had akathisia reaction. Another 25mg  of benadryl given as well as ativan for anxiety. I immediately evaluated the patient. She did not have respiratory distress or anaphylaxis. Symptoms improved after medications.   Patient care  transferred to A. Law PA-C at the end of my shift pending labs and reassessment.  Patient presentation, ED course, and plan of care discussed with review of all pertinent labs and imaging. Please see hernote for further details regarding further ED course and disposition. Suspect patient is covid positive.    Carol Martinez was evaluated in Emergency Department on 06/16/2019 for the symptoms described in the history of present illness. She was evaluated in the context of the global COVID-19 pandemic, which necessitated consideration that the patient might be at risk for infection with the SARS-CoV-2 virus that causes COVID-19. Institutional protocols and algorithms that pertain to the evaluation of patients at risk for COVID-19 are in a state of rapid change based on information released by regulatory bodies including the CDC and federal and state organizations. These policies and algorithms were followed during the patient's care in the ED.  Final Clinical Impression(s) / ED Diagnoses Final diagnoses:  Dehydration  Suspected COVID-19 virus infection  Diarrhea, unspecified type    Rx / DC Orders ED Discharge Orders         Ordered    ondansetron (ZOFRAN) 4 MG tablet  Every 6 hours     06/15/19 1631           Sherene Sireslbrizze, Elgie Maziarz E, PA-C 06/15/19 1548    Izaak Sahr, Caroleen HammanKaitlyn E, PA-C 06/16/19 1906    Virgina NorfolkCuratolo, Adam, DO 06/18/19 0915

## 2019-06-15 NOTE — ED Provider Notes (Signed)
Signout from previous provider, Emeterio Reeve, PA-C at shift change See previous providers note for full H&P  Briefly, patient presenting with generalized weakness, numerous episodes of diarrhea, loss of taste and smell.  Her daughter has COVID-19.  Initial point-of-care test negative, still high suspicion.  Labs are pending at shift change.  Patient was given IV fluids, Reglan, Benadryl and had a akathisia reaction.  Given Benadryl and Ativan and doing better.  Plan to reassess abdominal exam and determine need for CT.  If labs are stable and patient feeling better, plan for discharge.  4:32 PM labs returned with some mild dehydration with hemoglobin at 15.3 and ketones at 20.  Otherwise labs are stable.  On repeat abdominal exam patient has some left-sided mid abdominal pain which she states is normal for her ulcerative colitis.  She denies any new or different pain.  Will defer CT abdomen pelvis at this time.  Patient is feeling much better.  Will discharge home with Zofran considering akathisia reaction after Reglan.  Continue good hydration.  Send out Covid pending.  Quarantine precautions discussed.  Follow-up to PCP for further management of ulcerative colitis as this could be contributing to diarrhea.  Return precautions discussed.  Patient understands and agrees with plan.  Patient vitals stable throughout ED course and discharged in satisfactory condition.    Frederica Kuster, PA-C 06/15/19 1634    Hayden Rasmussen, MD 06/16/19 1014

## 2019-06-15 NOTE — Discharge Instructions (Signed)
Take Zofran every 6 hours as needed for nausea or vomiting.  Make sure to stay well-hydrated.  You will be called if you test positive for COVID-19.  Please quarantine at home for at least 10 days of symptoms and until your symptoms have been resolving for 3 days.  Please return to the emergency department if you develop any new or worsening symptoms.  Please return to the emergency department if you develop any new or worsening symptoms.

## 2019-06-16 DIAGNOSIS — U071 COVID-19: Secondary | ICD-10-CM | POA: Diagnosis not present

## 2019-07-26 ENCOUNTER — Ambulatory Visit: Payer: BC Managed Care – PPO | Attending: Internal Medicine

## 2019-07-26 DIAGNOSIS — Z20822 Contact with and (suspected) exposure to covid-19: Secondary | ICD-10-CM

## 2019-07-27 LAB — NOVEL CORONAVIRUS, NAA: SARS-CoV-2, NAA: NOT DETECTED

## 2019-10-11 DIAGNOSIS — Z01419 Encounter for gynecological examination (general) (routine) without abnormal findings: Secondary | ICD-10-CM | POA: Diagnosis not present

## 2019-10-11 DIAGNOSIS — Z1231 Encounter for screening mammogram for malignant neoplasm of breast: Secondary | ICD-10-CM | POA: Diagnosis not present

## 2019-10-11 DIAGNOSIS — K589 Irritable bowel syndrome without diarrhea: Secondary | ICD-10-CM | POA: Insufficient documentation

## 2019-10-11 DIAGNOSIS — N951 Menopausal and female climacteric states: Secondary | ICD-10-CM | POA: Diagnosis not present

## 2019-10-11 DIAGNOSIS — G43909 Migraine, unspecified, not intractable, without status migrainosus: Secondary | ICD-10-CM | POA: Insufficient documentation

## 2019-10-11 DIAGNOSIS — Z6825 Body mass index (BMI) 25.0-25.9, adult: Secondary | ICD-10-CM | POA: Diagnosis not present

## 2019-11-16 DIAGNOSIS — R030 Elevated blood-pressure reading, without diagnosis of hypertension: Secondary | ICD-10-CM | POA: Diagnosis not present

## 2019-11-16 DIAGNOSIS — M542 Cervicalgia: Secondary | ICD-10-CM | POA: Diagnosis not present

## 2019-11-16 DIAGNOSIS — M5412 Radiculopathy, cervical region: Secondary | ICD-10-CM | POA: Diagnosis not present

## 2019-11-16 DIAGNOSIS — M5020 Other cervical disc displacement, unspecified cervical region: Secondary | ICD-10-CM | POA: Diagnosis not present

## 2020-01-22 IMAGING — CR DG CHEST 2V
2 series · 2 of 2 positions shown · non-contrast
Comparison: 09/18/2016

CLINICAL DATA: Recent fall and altered mental status

EXAM:
CHEST - 2 VIEW

[chest pa]
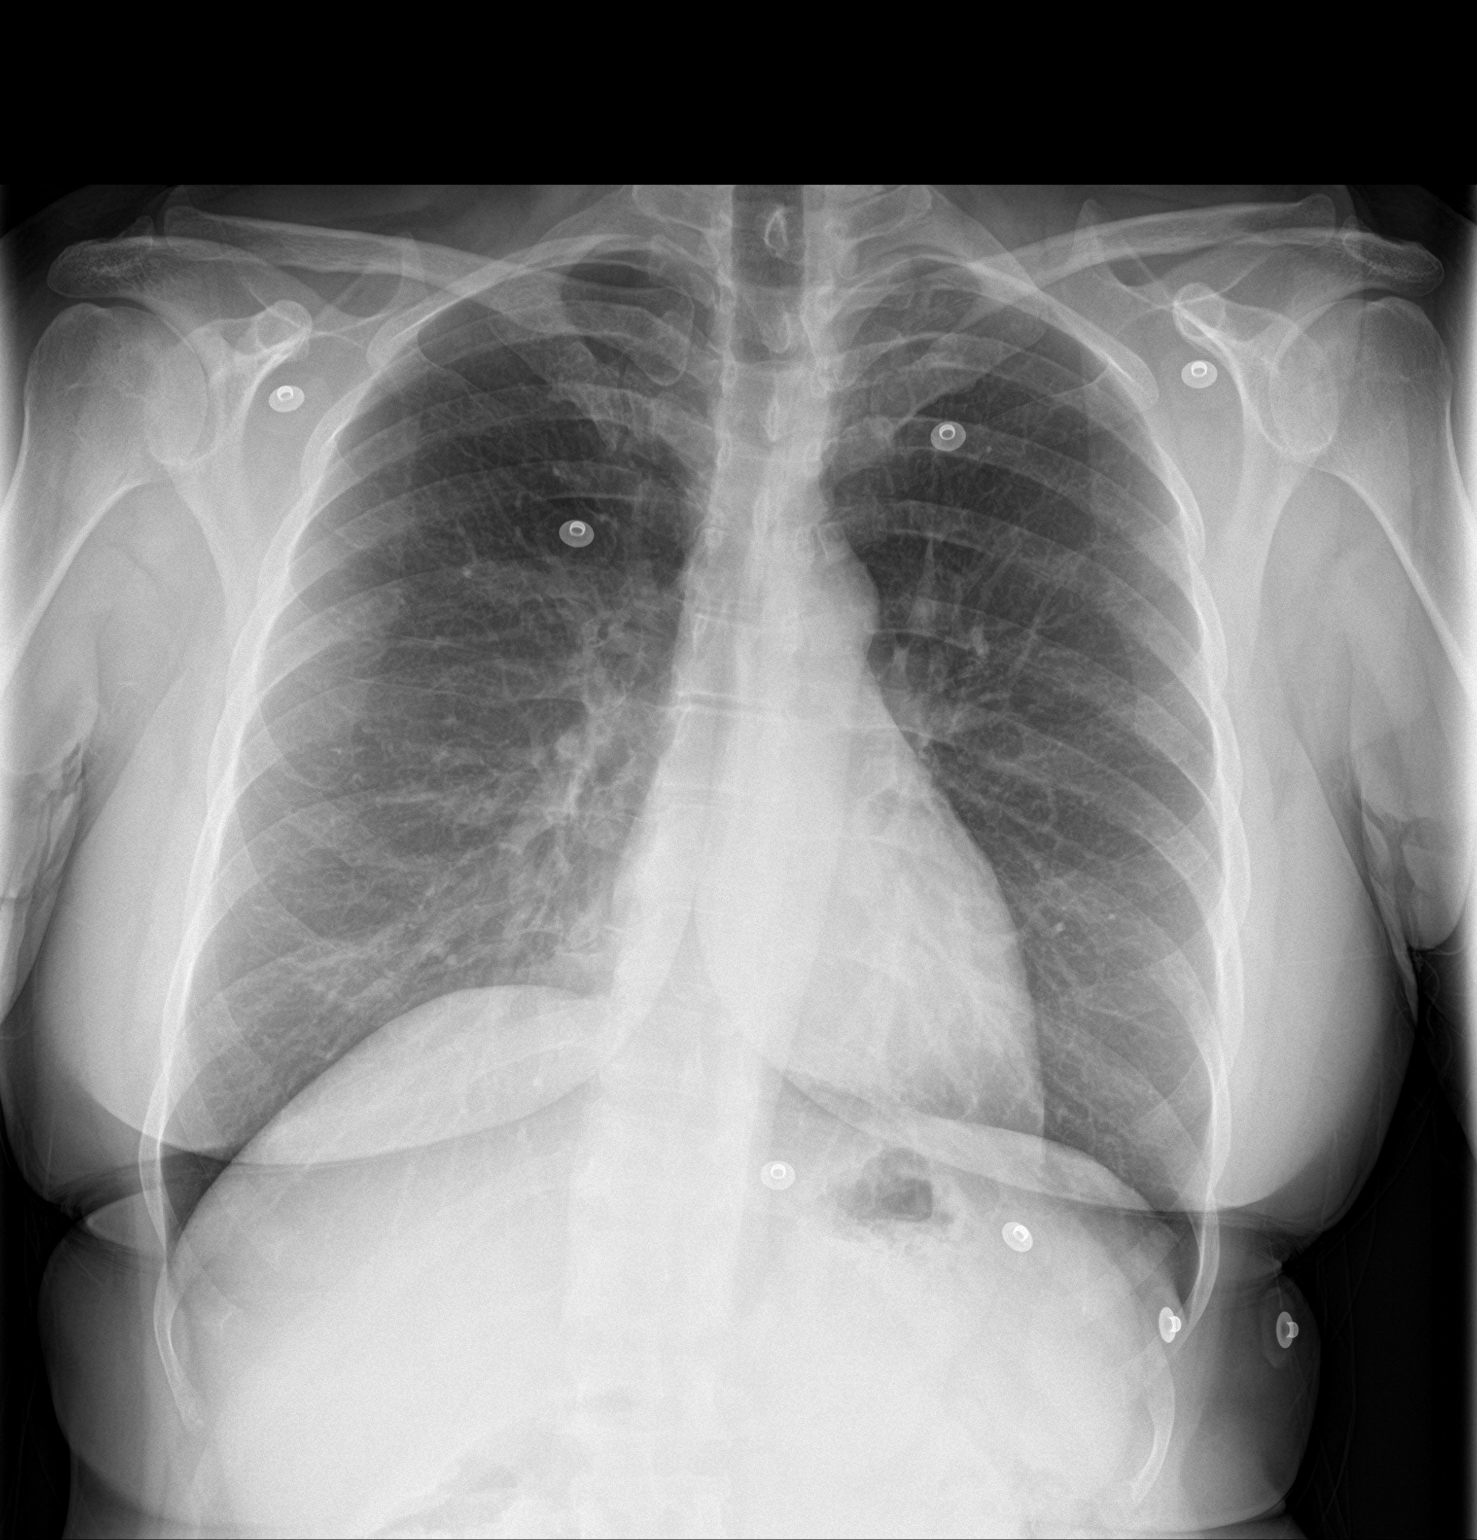

[chest lat]
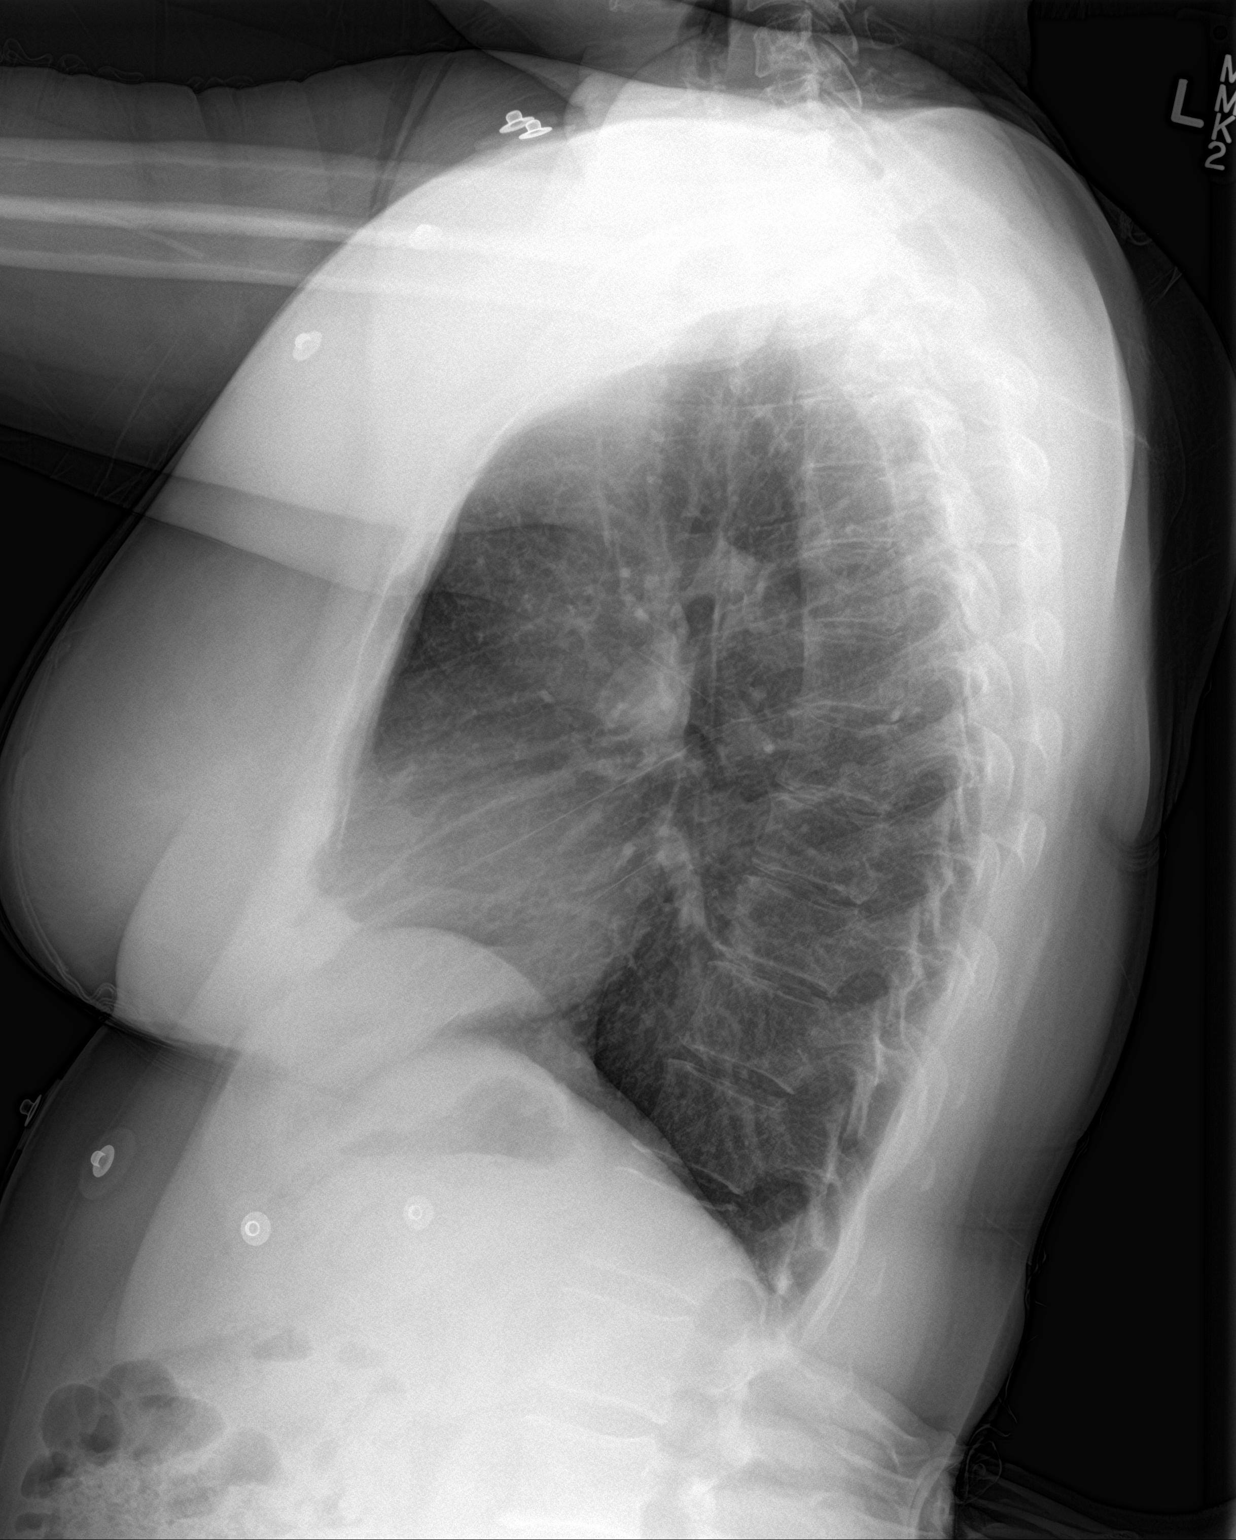

[2 of 2 positions shown; findings below may reference images not displayed]

FINDINGS: Cardiac shadow is stable. The lungs are well aerated bilaterally. No
focal infiltrate or sizable effusion is seen. No bony abnormality is
noted.
IMPRESSION: No active cardiopulmonary disease.

## 2020-03-13 DIAGNOSIS — R319 Hematuria, unspecified: Secondary | ICD-10-CM | POA: Diagnosis not present

## 2020-03-13 DIAGNOSIS — N200 Calculus of kidney: Secondary | ICD-10-CM | POA: Diagnosis not present

## 2020-03-23 DIAGNOSIS — R109 Unspecified abdominal pain: Secondary | ICD-10-CM | POA: Diagnosis not present

## 2020-03-23 DIAGNOSIS — N200 Calculus of kidney: Secondary | ICD-10-CM | POA: Diagnosis not present

## 2020-03-23 DIAGNOSIS — I700 Atherosclerosis of aorta: Secondary | ICD-10-CM | POA: Diagnosis not present

## 2020-03-23 DIAGNOSIS — R319 Hematuria, unspecified: Secondary | ICD-10-CM | POA: Diagnosis not present

## 2020-03-28 DIAGNOSIS — S92255A Nondisplaced fracture of navicular [scaphoid] of left foot, initial encounter for closed fracture: Secondary | ICD-10-CM | POA: Diagnosis not present

## 2020-04-11 ENCOUNTER — Encounter: Payer: Self-pay | Admitting: Podiatry

## 2020-04-11 ENCOUNTER — Other Ambulatory Visit: Payer: Self-pay | Admitting: Podiatry

## 2020-04-11 ENCOUNTER — Other Ambulatory Visit: Payer: Self-pay

## 2020-04-11 ENCOUNTER — Ambulatory Visit (INDEPENDENT_AMBULATORY_CARE_PROVIDER_SITE_OTHER): Payer: BC Managed Care – PPO

## 2020-04-11 ENCOUNTER — Ambulatory Visit (INDEPENDENT_AMBULATORY_CARE_PROVIDER_SITE_OTHER): Payer: BC Managed Care – PPO | Admitting: Podiatry

## 2020-04-11 DIAGNOSIS — S9032XA Contusion of left foot, initial encounter: Secondary | ICD-10-CM

## 2020-04-11 DIAGNOSIS — M778 Other enthesopathies, not elsewhere classified: Secondary | ICD-10-CM | POA: Diagnosis not present

## 2020-04-11 DIAGNOSIS — M779 Enthesopathy, unspecified: Secondary | ICD-10-CM | POA: Diagnosis not present

## 2020-04-11 NOTE — Progress Notes (Signed)
Erroneous entry, disregard

## 2020-04-13 NOTE — Progress Notes (Signed)
Subjective:   Patient ID: Carol Martinez, female   DOB: 54 y.o.   MRN: [id number]   HPI Patient states that about a month ago she injured her left ankle and it remained sore and it seems like it is in the joint.  Patient states she had bruising which is gotten better and had occurred across the foot and into the outside of the foot.  Patient does not smoke likes to be active   Review of Systems  All other systems reviewed and are negative.       Objective:  Physical Exam Vitals and nursing note reviewed.  Constitutional:      Appearance: She is well-developed.  Pulmonary:     Effort: Pulmonary effort is normal.  Musculoskeletal:        General: Normal range of motion.  Skin:    General: Skin is warm.  Neurological:     Mental Status: She is alert.     Neurovascular status found to be intact muscle strength was found to be adequate range of motion within normal limits.  Patient is splinting on the lateral side left foot and the pain appears to be centralized into the sinus tarsi.  The fibula and on the outside is mildly tender but improving but the pain seems to have coalesced into the joint.  Good digital perfusion well oriented x3      Assessment:  Probability for sinus tarsitis inflammatory capsulitis left secondary to inflammation from the previous injury with moderate swelling still noted     Plan:  H&P x-rays reviewed discussed.  Today I did sterile prep and injected the sinus tarsi 3 mg dexamethasone Kenalog 5 mg Xylocaine and I advised on anti-inflammatories and support.  If this does not improve in the next 3 to 4 weeks I want to see him back but I did explain total recovery will probably be 8 to 12 weeks  X-rays were negative for signs of fracture or diastases injury currently left ankle foot

## 2020-06-04 DIAGNOSIS — R300 Dysuria: Secondary | ICD-10-CM | POA: Diagnosis not present

## 2020-06-04 DIAGNOSIS — Z0000 Encounter for general adult medical examination without abnormal findings: Secondary | ICD-10-CM | POA: Diagnosis not present

## 2020-06-04 DIAGNOSIS — Z13220 Encounter for screening for lipoid disorders: Secondary | ICD-10-CM | POA: Diagnosis not present

## 2020-09-14 DIAGNOSIS — Z20822 Contact with and (suspected) exposure to covid-19: Secondary | ICD-10-CM | POA: Diagnosis not present

## 2020-12-12 ENCOUNTER — Ambulatory Visit: Payer: BC Managed Care – PPO | Admitting: Podiatry

## 2020-12-17 IMAGING — CT CT ABDOMEN AND PELVIS WITH CONTRAST
2 of 5 series · 16 of 46 positions shown, 18 images · IV contrast (APPLIED)
Comparison: None.

CLINICAL DATA: Right upper quadrant pain 2 weeks with diarrhea
today. Generalized body aches. Previous appendectomy.

EXAM:
CT ABDOMEN AND PELVIS WITH CONTRAST
TECHNIQUE: Multidetector CT imaging of the abdomen and pelvis was performed
using the standard protocol following bolus administration of
intravenous contrast.
CONTRAST:  100mL OMNIPAQUE IOHEXOL 300 MG/ML  SOLN

[Series 3: abdomen 5.0 · axial · 0.79mm/px · z∈[+751,+1091]mm · 13 of 80 slices shown, 15 images]
[im 6/80  soft-tissue]
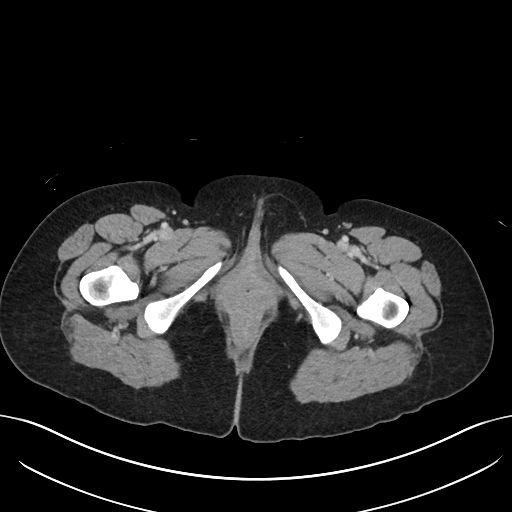
[im 6/80  bone]
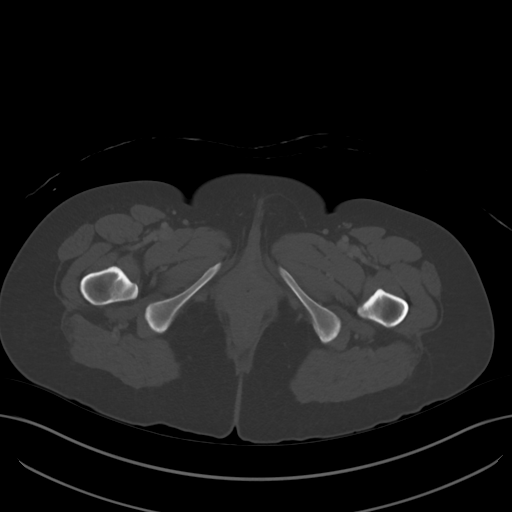
[im 11/80  soft-tissue]
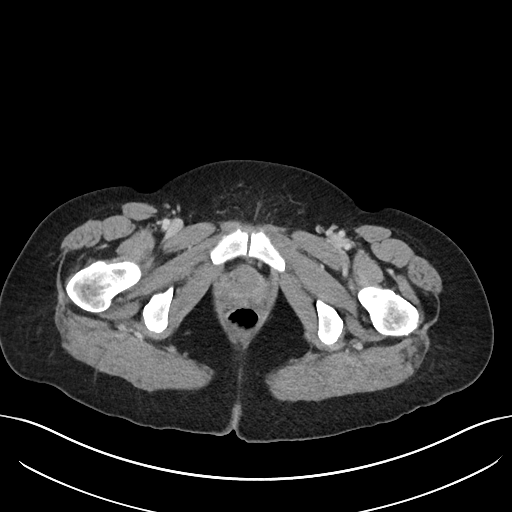
[im 16/80  soft-tissue]
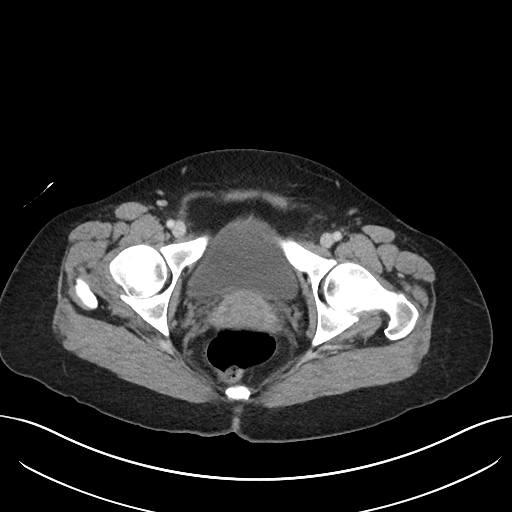
[im 22/80  soft-tissue]
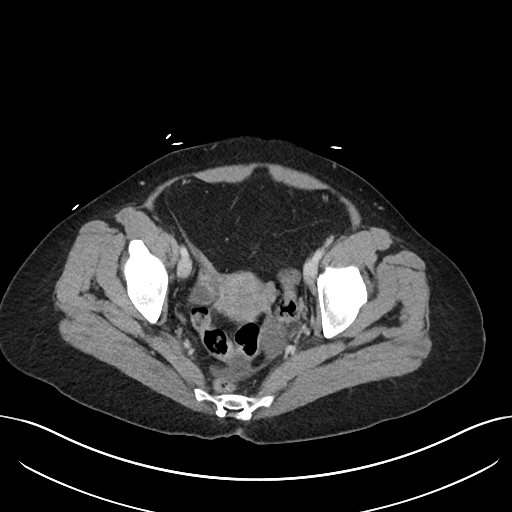
[im 27/80  soft-tissue]
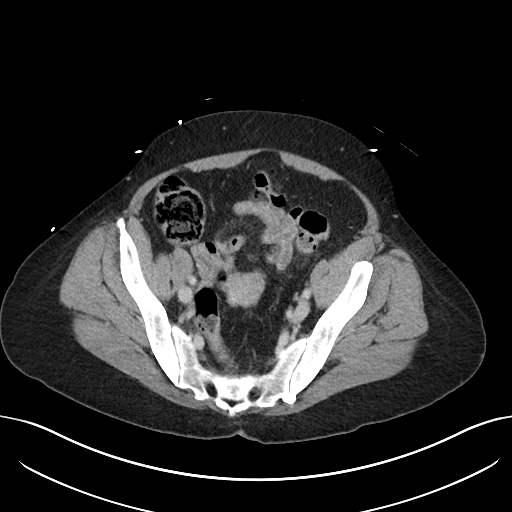
[im 32/80  soft-tissue]
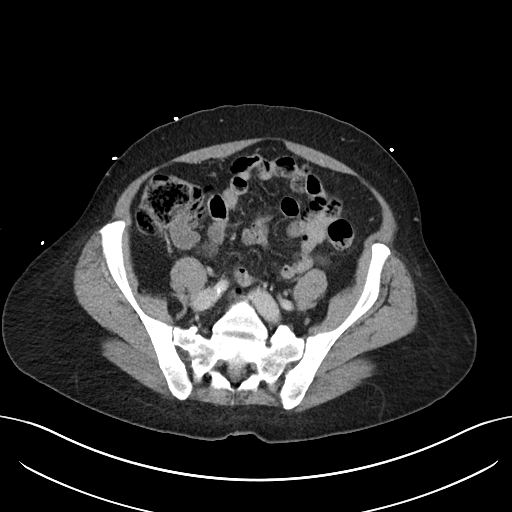
[im 43/80  soft-tissue]
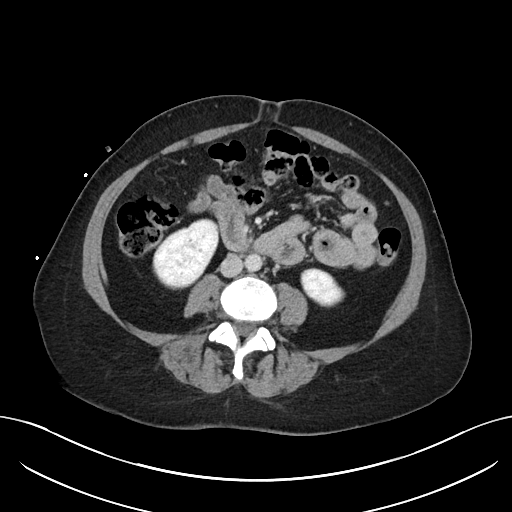
[im 48/80  soft-tissue]
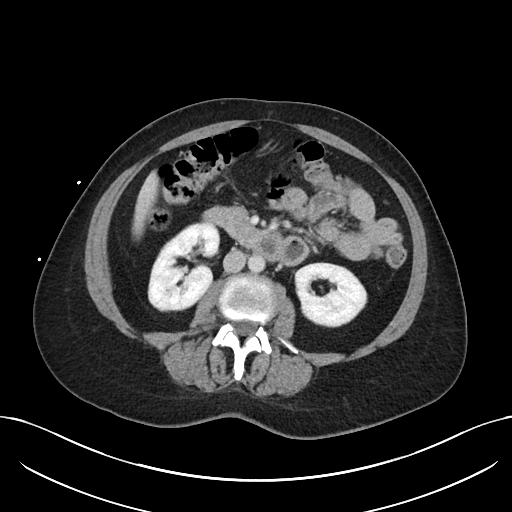
[im 53/80  soft-tissue]
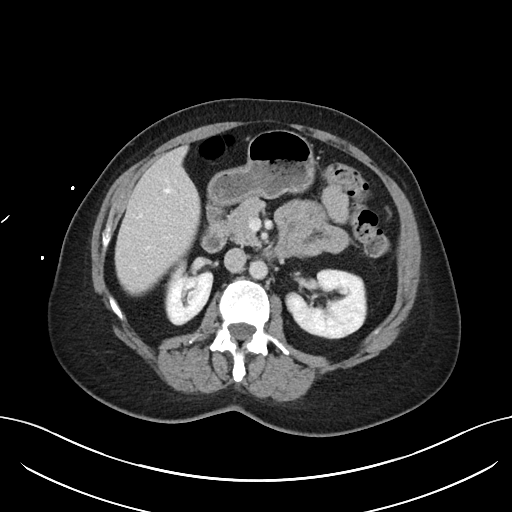
[im 53/80  bone]
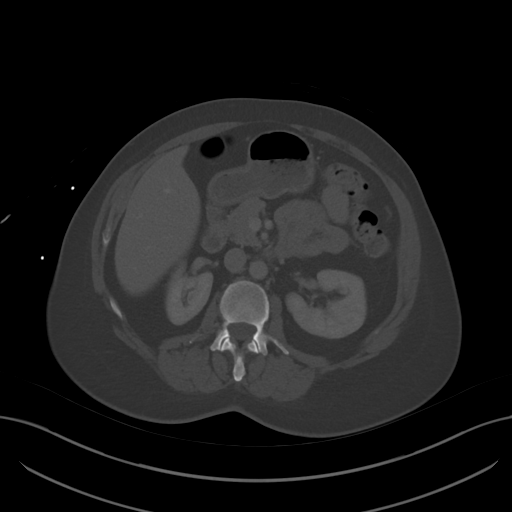
[im 58/80  soft-tissue]
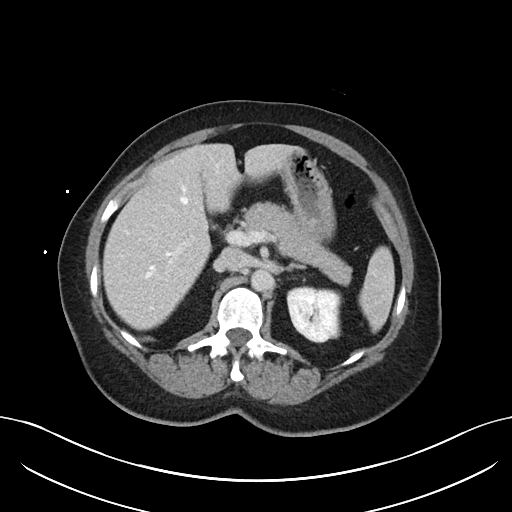
[im 64/80  soft-tissue]
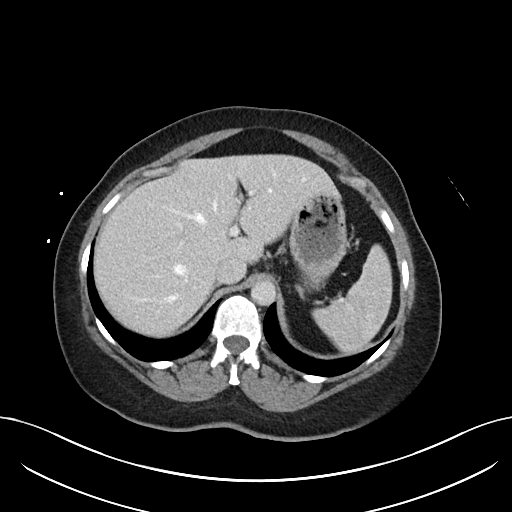
[im 69/80  soft-tissue]
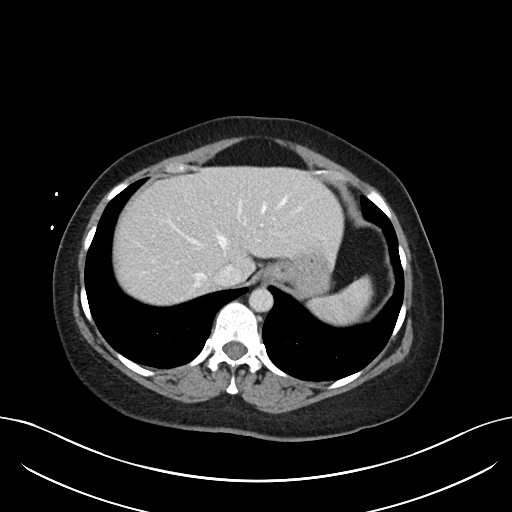
[im 74/80  soft-tissue]
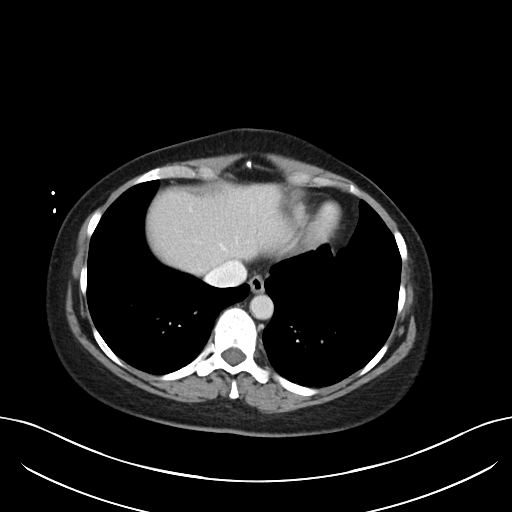

[Series 6: abdomen 3.0 mpr cor · coronal · 0.66mm/px · 3 of 89 slices shown]
[im 30/89  soft-tissue]
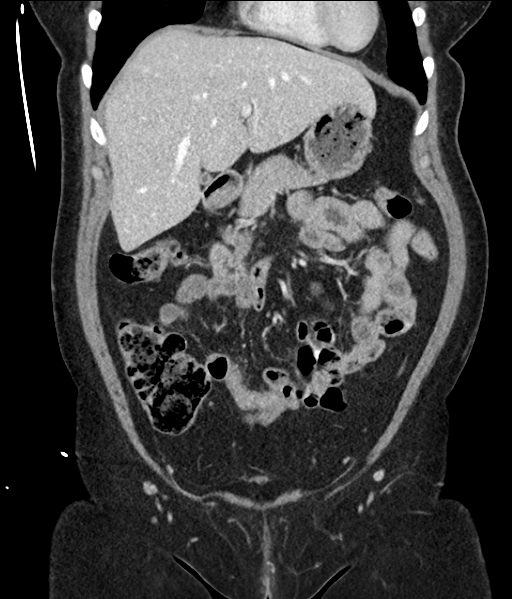
[im 40/89  soft-tissue]
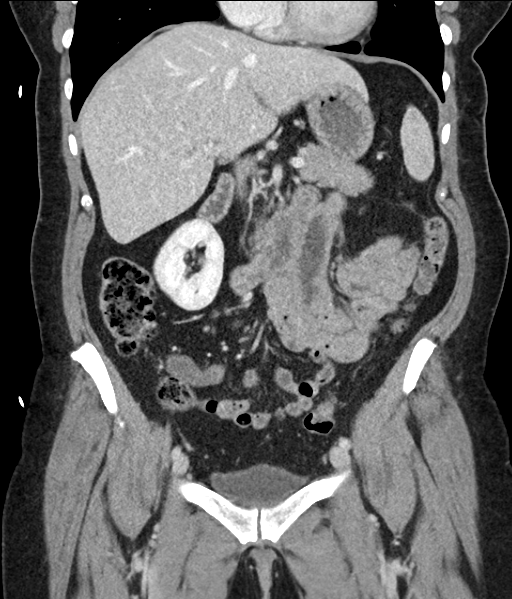
[im 49/89  soft-tissue]
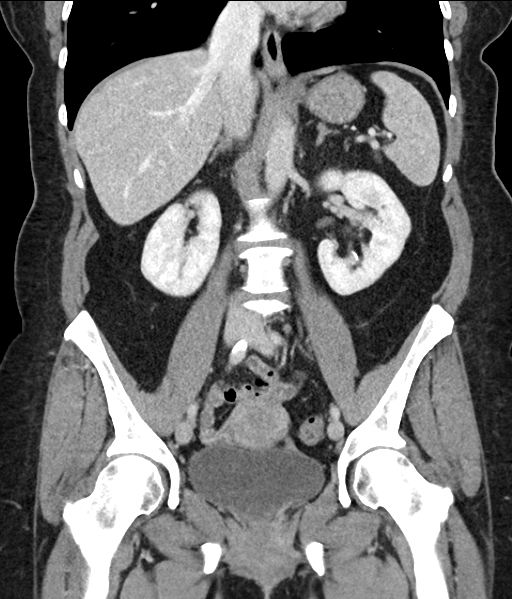

[16 of 46 positions shown; findings below may reference images not displayed]

FINDINGS: Lower chest: Lung bases are normal.

Hepatobiliary: Gallbladder is contracted. Liver and biliary tree are
normal.

Pancreas: Normal.

Spleen: Normal.

Adrenals/Urinary Tract: Adrenal glands are normal. Kidneys are
normal in size without hydronephrosis. 3 mm nonobstructing stone
over the lower pole left kidney. Ureters and bladder are normal.

Stomach/Bowel: Stomach is normal. Small bowel is normal in caliber
as there is mild wall thickening over several jejunal loops over the
left mid abdomen which is nonspecific as could be seen with regional
enteritis of infectious or inflammatory nature. Previous
appendectomy. Colon is within normal.

Vascular/Lymphatic: Normal.

Reproductive: Normal.

Other: Mild free fluid over the pelvis likely physiologic. No focal
inflammatory change. Small umbilical hernia containing only
peritoneal fat.

Musculoskeletal: Mild degenerative change of the lumbar spine with
multilevel disc disease worse at the L4-5 level. Mild degenerative
change of the hips.
IMPRESSION: Nonspecific mild wall thickening over several jejunal loops in the
left mid abdomen which could be seen in a regional enteritis of
infectious or inflammatory nature. Otherwise, no acute findings in
the abdomen/pelvis.

Nonobstructing 3 mm stone over the lower pole left kidney.

Small umbilical hernia containing only peritoneal fat.

## 2020-12-19 ENCOUNTER — Encounter: Payer: Self-pay | Admitting: Podiatry

## 2020-12-19 ENCOUNTER — Ambulatory Visit (INDEPENDENT_AMBULATORY_CARE_PROVIDER_SITE_OTHER): Payer: BC Managed Care – PPO

## 2020-12-19 ENCOUNTER — Other Ambulatory Visit: Payer: Self-pay

## 2020-12-19 ENCOUNTER — Ambulatory Visit: Payer: BC Managed Care – PPO | Admitting: Podiatry

## 2020-12-19 DIAGNOSIS — M722 Plantar fascial fibromatosis: Secondary | ICD-10-CM

## 2020-12-19 DIAGNOSIS — M79672 Pain in left foot: Secondary | ICD-10-CM | POA: Diagnosis not present

## 2020-12-19 DIAGNOSIS — M79671 Pain in right foot: Secondary | ICD-10-CM

## 2020-12-19 MED ORDER — TRIAMCINOLONE ACETONIDE 10 MG/ML IJ SUSP
10.0000 mg | Freq: Once | INTRAMUSCULAR | Status: AC
  Administered 2020-12-19: 10 mg

## 2020-12-19 MED ORDER — DICLOFENAC SODIUM 75 MG PO TBEC: 75.0000 mg | DELAYED_RELEASE_TABLET | Freq: Two times a day (BID) | ORAL | 2 refills | Status: DC

## 2020-12-19 NOTE — Patient Instructions (Signed)

## 2020-12-23 NOTE — Progress Notes (Signed)
Subjective:   Patient ID: Carol Martinez, female   DOB: 55 y.o.   MRN: [id number]   HPI Patient presents stating she has a lot of pain in the bottom of her heel left over right and does not remember specific injury.  Its been going on around a month and is gotten worse over the last couple weeks and the right is not as intense F2   ROS      Objective:  Physical Exam  Neurovascular status found to be intact muscle strength was found to be adequate with exquisite discomfort heel left over right inflammation fluid around the medial band     Assessment:  Acute Planter fasciitis left over right with inflammation fluid     Plan:  H&P reviewed condition sterile prep injected the left plantar fascia 3 mg Kenalog 5 mg Xylocaine applied fascial brace gave instructions for support physical therapy and reappoint to recheck several weeks or earlier if needed  X-rays indicate there is small spur no indication stress fracture arthritis

## 2021-02-19 DIAGNOSIS — Z1231 Encounter for screening mammogram for malignant neoplasm of breast: Secondary | ICD-10-CM | POA: Diagnosis not present

## 2021-02-19 DIAGNOSIS — Z01419 Encounter for gynecological examination (general) (routine) without abnormal findings: Secondary | ICD-10-CM | POA: Diagnosis not present

## 2021-02-19 DIAGNOSIS — N959 Unspecified menopausal and perimenopausal disorder: Secondary | ICD-10-CM | POA: Diagnosis not present

## 2021-02-19 DIAGNOSIS — Z6826 Body mass index (BMI) 26.0-26.9, adult: Secondary | ICD-10-CM | POA: Diagnosis not present

## 2021-03-06 DIAGNOSIS — R319 Hematuria, unspecified: Secondary | ICD-10-CM | POA: Diagnosis not present

## 2021-03-06 DIAGNOSIS — N201 Calculus of ureter: Secondary | ICD-10-CM | POA: Diagnosis not present

## 2021-04-11 ENCOUNTER — Ambulatory Visit: Payer: BC Managed Care – PPO | Admitting: Podiatry

## 2021-04-16 DIAGNOSIS — Z1211 Encounter for screening for malignant neoplasm of colon: Secondary | ICD-10-CM | POA: Diagnosis not present

## 2021-04-16 DIAGNOSIS — R197 Diarrhea, unspecified: Secondary | ICD-10-CM | POA: Diagnosis not present

## 2021-04-16 DIAGNOSIS — Z86010 Personal history of colonic polyps: Secondary | ICD-10-CM | POA: Diagnosis not present

## 2021-04-22 ENCOUNTER — Other Ambulatory Visit: Payer: Self-pay

## 2021-04-22 ENCOUNTER — Ambulatory Visit (INDEPENDENT_AMBULATORY_CARE_PROVIDER_SITE_OTHER): Payer: BC Managed Care – PPO | Admitting: Podiatry

## 2021-04-22 ENCOUNTER — Encounter: Payer: Self-pay | Admitting: Podiatry

## 2021-04-22 DIAGNOSIS — M722 Plantar fascial fibromatosis: Secondary | ICD-10-CM

## 2021-04-22 MED ORDER — TRIAMCINOLONE ACETONIDE 10 MG/ML IJ SUSP
10.0000 mg | Freq: Once | INTRAMUSCULAR | Status: AC
  Administered 2021-04-22: 10 mg

## 2021-04-24 NOTE — Progress Notes (Signed)
Subjective:   Patient ID: Carol Martinez, female   DOB: 55 y.o.   MRN: [id number]   HPI Patient states she has had severe pain in her left heel that has not responded and admits she should have been earlier but she is having problems doing any weightbearing on the heel at the current time.  Patient states is becoming quite debilitating with mild pain right but not to the same degree   ROS      Objective:  Physical Exam  Neurovascular status intact negative Denna Haggard' sign noted exquisite discomfort left plantar fascial at insertion to the calcaneus with fluid buildup around the joint surface.  Patient is found to have good digital perfusion well oriented x3 and no other pathology noted      Assessment:  Acute plantar fasciitis left inflammation fluid around the medial band     Plan:  H&P and since this has created problems with any form of weightbearing I have recommended immobilization with Cam walker and patient is placed in a cam walker.  I then went ahead did sterile prep and injected the plantar fascial left 3 mg Kenalog 5 mg Xylocaine and advised on anti-inflammatories and the possibility this may require surgery for shockwave therapy

## 2021-05-13 ENCOUNTER — Ambulatory Visit (INDEPENDENT_AMBULATORY_CARE_PROVIDER_SITE_OTHER): Payer: BC Managed Care – PPO | Admitting: Podiatry

## 2021-05-13 ENCOUNTER — Encounter: Payer: Self-pay | Admitting: Podiatry

## 2021-05-13 ENCOUNTER — Other Ambulatory Visit: Payer: Self-pay

## 2021-05-13 DIAGNOSIS — M722 Plantar fascial fibromatosis: Secondary | ICD-10-CM | POA: Diagnosis not present

## 2021-05-13 DIAGNOSIS — M7752 Other enthesopathy of left foot: Secondary | ICD-10-CM

## 2021-05-13 MED ORDER — TRIAMCINOLONE ACETONIDE 10 MG/ML IJ SUSP
10.0000 mg | Freq: Once | INTRAMUSCULAR | Status: AC
  Administered 2021-05-13: 10 mg

## 2021-05-15 NOTE — Progress Notes (Signed)
Subjective:   Patient ID: Carol Martinez, female   DOB: 55 y.o.   MRN: [id number]   HPI Patient states she is still having some discomfort in the bottom of her foot heel and also has discomfort on the outside of the left foot.  States that she feels like she needs more foot support and states that the pain is frustrating and she cannot wear this air fracture walker forever   ROS      Objective:  Physical Exam  Neurovascular status intact with patient found to have inflammation mostly in the sinus tarsi left with inflammation fluid buildup and is noted to have moderate flattening the arch mild plantar fascial symptomatology     Assessment:  Acute inflammatory sinus tarsitis left along with fasciitis-like symptoms and structural flatfoot deformity     Plan:  H&P reviewed all conditions sterile prep and injected the sinus tarsi left 3 mg Kenalog 5 mg Xylocaine advised on foot support and casted for functional orthotic devices and discussed trying to reduce the air fracture walker.  Reappoint for [place] to recheck when orthotics return or earlier if needed

## 2021-05-30 ENCOUNTER — Ambulatory Visit: Payer: BC Managed Care – PPO | Admitting: Neurology

## 2021-06-05 DIAGNOSIS — Z8619 Personal history of other infectious and parasitic diseases: Secondary | ICD-10-CM | POA: Diagnosis not present

## 2021-06-05 DIAGNOSIS — R197 Diarrhea, unspecified: Secondary | ICD-10-CM | POA: Diagnosis not present

## 2021-06-10 DIAGNOSIS — E7800 Pure hypercholesterolemia, unspecified: Secondary | ICD-10-CM | POA: Diagnosis not present

## 2021-06-10 DIAGNOSIS — Z131 Encounter for screening for diabetes mellitus: Secondary | ICD-10-CM | POA: Diagnosis not present

## 2021-06-10 DIAGNOSIS — Z0000 Encounter for general adult medical examination without abnormal findings: Secondary | ICD-10-CM | POA: Diagnosis not present

## 2021-06-12 ENCOUNTER — Encounter: Payer: Self-pay | Admitting: Podiatry

## 2021-06-12 ENCOUNTER — Other Ambulatory Visit: Payer: Self-pay

## 2021-06-12 ENCOUNTER — Ambulatory Visit: Payer: BC Managed Care – PPO

## 2021-06-12 ENCOUNTER — Ambulatory Visit (INDEPENDENT_AMBULATORY_CARE_PROVIDER_SITE_OTHER): Payer: BC Managed Care – PPO | Admitting: Podiatry

## 2021-06-12 DIAGNOSIS — M722 Plantar fascial fibromatosis: Secondary | ICD-10-CM

## 2021-06-12 DIAGNOSIS — M7752 Other enthesopathy of left foot: Secondary | ICD-10-CM | POA: Diagnosis not present

## 2021-06-13 NOTE — Progress Notes (Signed)
SITUATION: °Reason for Visit: Fitting and Delivery of Custom Fabricated Foot Orthoses °Patient Report: Patient reports comfort and is satisfied with device. ° °OBJECTIVE DATA: °Patient History / Diagnosis:  No change in pathology °Provided Device:  Functional foot orthotics ° °GOAL OF ORTHOSIS °- Improve gait °- Decrease energy expenditure °- Improve Balance °- Provide Triplanar stability of foot complex °- Facilitate motion ° °ACTIONS PERFORMED °Patient was fit with foot orthotics trimmed to shoe last. Patient tolerated fittign procedure. Device was modified as follows to better fit patient: °- Toe plate was trimmed to shoe last ° °Patient was provided with verbal and written instruction and demonstration regarding donning, doffing, wear, care, proper fit, function, purpose, cleaning, and use of the orthosis and in all related precautions and risks and benefits regarding the orthosis. ° °Patient was also provided with verbal instruction regarding how to report any failures or malfunctions of the orthosis and necessary follow up care. Patient was also instructed to contact our office regarding any change in status that may affect the function of the orthosis. ° °Patient demonstrated independence with proper donning, doffing, and fit and verbalized understanding of all instructions. ° °PLAN: °Patient is to follow up in one week or as necessary (PRN). All questions were answered and concerns addressed. Plan of care was discussed with and agreed upon by the patient. ° °

## 2021-06-13 NOTE — Progress Notes (Signed)
Subjective:   Patient ID: Carol Martinez, female   DOB: 55 y.o.   MRN: [id number]   HPI Patient states she is improved and has had a reduction of her pain after the treatment we did and is excited for her orthotics   ROS      Objective:  Physical Exam  Neurovascular status intact with diminishment of discomfort in the ankle sinus tarsi and into the plantar fascial mild pain still noted but improved from previous     Assessment:  Improvement of capsulitis fasciitis-like symptomatology     Plan:  H&P reviewed condition recommended anti-inflammatories as needed and to begin and break in the orthotics and possible further more aggressive treatment in future depending on response to conservative treatment

## 2021-08-06 DIAGNOSIS — N200 Calculus of kidney: Secondary | ICD-10-CM | POA: Diagnosis not present

## 2021-08-06 DIAGNOSIS — R319 Hematuria, unspecified: Secondary | ICD-10-CM | POA: Diagnosis not present

## 2022-04-15 DIAGNOSIS — Z1321 Encounter for screening for nutritional disorder: Secondary | ICD-10-CM | POA: Diagnosis not present

## 2022-04-15 DIAGNOSIS — Z130 Encounter for screening for diseases of the blood and blood-forming organs and certain disorders involving the immune mechanism: Secondary | ICD-10-CM | POA: Diagnosis not present

## 2022-04-15 DIAGNOSIS — Z01419 Encounter for gynecological examination (general) (routine) without abnormal findings: Secondary | ICD-10-CM | POA: Diagnosis not present

## 2022-04-15 DIAGNOSIS — Z124 Encounter for screening for malignant neoplasm of cervix: Secondary | ICD-10-CM | POA: Diagnosis not present

## 2022-04-15 DIAGNOSIS — Z1231 Encounter for screening mammogram for malignant neoplasm of breast: Secondary | ICD-10-CM | POA: Diagnosis not present

## 2022-04-15 DIAGNOSIS — Z131 Encounter for screening for diabetes mellitus: Secondary | ICD-10-CM | POA: Diagnosis not present

## 2022-04-15 DIAGNOSIS — Z1329 Encounter for screening for other suspected endocrine disorder: Secondary | ICD-10-CM | POA: Diagnosis not present

## 2022-04-15 DIAGNOSIS — Z6826 Body mass index (BMI) 26.0-26.9, adult: Secondary | ICD-10-CM | POA: Diagnosis not present

## 2022-05-06 DIAGNOSIS — Z3046 Encounter for surveillance of implantable subdermal contraceptive: Secondary | ICD-10-CM | POA: Diagnosis not present

## 2022-06-01 DIAGNOSIS — R079 Chest pain, unspecified: Secondary | ICD-10-CM | POA: Diagnosis not present

## 2022-06-01 DIAGNOSIS — F419 Anxiety disorder, unspecified: Secondary | ICD-10-CM | POA: Diagnosis not present

## 2022-06-01 DIAGNOSIS — Z888 Allergy status to other drugs, medicaments and biological substances status: Secondary | ICD-10-CM | POA: Diagnosis not present

## 2022-06-01 DIAGNOSIS — R0789 Other chest pain: Secondary | ICD-10-CM | POA: Diagnosis not present

## 2022-06-01 DIAGNOSIS — R4701 Aphasia: Secondary | ICD-10-CM | POA: Diagnosis not present

## 2022-06-01 DIAGNOSIS — R29818 Other symptoms and signs involving the nervous system: Secondary | ICD-10-CM | POA: Diagnosis not present

## 2022-06-15 DIAGNOSIS — R4701 Aphasia: Secondary | ICD-10-CM | POA: Diagnosis not present

## 2022-06-25 DIAGNOSIS — J069 Acute upper respiratory infection, unspecified: Secondary | ICD-10-CM | POA: Diagnosis not present

## 2022-06-25 DIAGNOSIS — R051 Acute cough: Secondary | ICD-10-CM | POA: Diagnosis not present

## 2022-07-25 DIAGNOSIS — R0689 Other abnormalities of breathing: Secondary | ICD-10-CM | POA: Diagnosis not present

## 2022-07-25 DIAGNOSIS — R6883 Chills (without fever): Secondary | ICD-10-CM | POA: Diagnosis not present

## 2022-07-25 DIAGNOSIS — R4701 Aphasia: Secondary | ICD-10-CM | POA: Diagnosis not present

## 2022-07-25 DIAGNOSIS — Z03818 Encounter for observation for suspected exposure to other biological agents ruled out: Secondary | ICD-10-CM | POA: Diagnosis not present

## 2022-07-25 DIAGNOSIS — B349 Viral infection, unspecified: Secondary | ICD-10-CM | POA: Diagnosis not present

## 2022-07-25 DIAGNOSIS — R5383 Other fatigue: Secondary | ICD-10-CM | POA: Diagnosis not present

## 2022-08-05 DIAGNOSIS — R3121 Asymptomatic microscopic hematuria: Secondary | ICD-10-CM | POA: Diagnosis not present

## 2022-08-05 DIAGNOSIS — N200 Calculus of kidney: Secondary | ICD-10-CM | POA: Diagnosis not present

## 2022-09-18 ENCOUNTER — Ambulatory Visit (INDEPENDENT_AMBULATORY_CARE_PROVIDER_SITE_OTHER): Payer: BC Managed Care – PPO | Admitting: Diagnostic Neuroimaging

## 2022-09-18 ENCOUNTER — Encounter: Payer: Self-pay | Admitting: Diagnostic Neuroimaging

## 2022-09-18 VITALS — BP 122/78 | HR 62 | Ht 62.0 in | Wt 143.0 lb

## 2022-09-18 DIAGNOSIS — R4689 Other symptoms and signs involving appearance and behavior: Secondary | ICD-10-CM | POA: Diagnosis not present

## 2022-09-18 NOTE — Patient Instructions (Signed)
INTERMITTENT ABNORMAL SPELLS / TRANSIENT APHASIA (ddx: TIA, metabolic, autoimmune, stress reactions) - check MRI brain, EEG - optimize sleep, stress, nutrition, exercise

## 2022-09-18 NOTE — Progress Notes (Signed)
[facility]  PATIENT: Carol Martinez DOB: [birth year]  REFERRING CLINICIAN: Alexis Frock, PA HISTORY FROM: patient  REASON FOR VISIT: new consult   HISTORICAL  CHIEF COMPLAINT:  Chief Complaint  Patient presents with   Aphasia    RM 6 alone Pt is well, stated she had her first episodes of aphasia in Dec but none since. She also mentions people around her have noticed she is repeated and forgets things. She is also having migraines. All post covid 2020    HISTORY OF PRESENT ILLNESS:   57 year old female here for evaluation of abnormal spells.  December 2020 patient had COVID with severe symptoms for about 2 months.  Ever since that time she has had some issues with mild memory loss and migraine headaches.  In December 2023 she was on vacation, when all of a sudden she started having confusion and difficulty speaking.  She had "aphasia" for several hours and was taken to local hospital.  CAT scan and lab testing were unremarkable.  ER notes mention history from patient's husband states that she has had similar episodes over the past 29 years since they have been married.  These may stem from history of traumatic childhood and stress reactions.  Patient has seen therapist counselor in the past but not in the last 2 years.  She feels like her stress reaction issues are stable.   REVIEW OF SYSTEMS: Full 14 system review of systems performed and negative with exception of: as per HPI.  ALLERGIES: Allergies  Allergen Reactions   Phenergan [Promethazine Hcl] Anaphylaxis   Aspirin Hives and Itching   Penicillins Hives    Has patient had a PCN reaction causing immediate rash, facial/tongue/throat swelling, SOB or lightheadedness with hypotension: Yes Has patient had a PCN reaction causing severe rash involving mucus membranes or skin necrosis: No Has patient had a PCN reaction that required hospitalization: Unknown Has patient had a PCN reaction occurring within  the last 10 years: No If all of the above answers are "NO", then may proceed with Cephalosporin use.   Latex Rash    HOME MEDICATIONS: Outpatient Medications Prior to Visit  Medication Sig Dispense Refill   cholecalciferol (VITAMIN D3) 25 MCG (1000 UNIT) tablet Take 1,000 Units by mouth daily.     estradiol (ESTRACE) 2 MG tablet Take 2 mg by mouth daily.     nitrofurantoin, macrocrystal-monohydrate, (MACROBID) 100 MG capsule 1 capsule     progesterone (PROMETRIUM) 100 MG capsule Take 100 mg by mouth daily.     Specialty Vitamins Products (COLLAGEN ULTRA) CAPS Take 1 each by mouth daily.     No facility-administered medications prior to visit.    PAST MEDICAL HISTORY: Past Medical History:  Diagnosis Date   Allergy    Anxiety    Bruising 09/08/2013   Cataract    Depression    GERD (gastroesophageal reflux disease)    Heart murmur    Post-partum depression     PAST SURGICAL HISTORY: Past Surgical History:  Procedure Laterality Date   APPENDECTOMY     EYE SURGERY     TONSILLECTOMY      FAMILY HISTORY: Family History  Problem Relation Age of Onset   Alzheimer's disease Father    Cancer Maternal Grandmother        Blood problem   Alzheimer's disease Paternal Grandmother     SOCIAL HISTORY: Social History   Socioeconomic History   Marital status: Married    Spouse name: Land  Number of children: 1   Years of education: Not on file   Highest education level: Not on file  Occupational History   Occupation: Aesthetician    Comment: [employer]  Tobacco Use   Smoking status: Former    Packs/day: 1.00    Years: 27.00    Additional pack years: 0.00    Total pack years: 27.00    Types: Cigarettes    Start date: 06/30/1980    Quit date: 04/08/2008    Years since quitting: 14.4   Smokeless tobacco: Never  Vaping Use   Vaping Use: Never used  Substance and Sexual Activity   Alcohol use: Yes    Alcohol/week: 7.0 standard drinks of alcohol    Types: 7  Glasses of wine per week    Comment: Glass wine daily   Drug use: No   Sexual activity: Not on file  Other Topics Concern   Not on file  Social History Narrative   Lives with her husband and their daughter.   Moved to the [place] from [place] in 1995.   Social Determinants of Health   Financial Resource Strain: Not on file  Food Insecurity: Not on file  Transportation Needs: Not on file  Physical Activity: Not on file  Stress: Not on file  Social Connections: Not on file  Intimate Partner Violence: Not on file     PHYSICAL EXAM  GENERAL EXAM/CONSTITUTIONAL: Vitals:  Vitals:   09/18/22 1559  BP: 122/78  Pulse: 62  Weight: 143 lb (64.9 kg)  Height: 5\' 2"  (1.575 m)   Body mass index is 26.16 kg/m. Wt Readings from Last 3 Encounters:  09/18/22 143 lb (64.9 kg)  10/03/18 145 lb (65.8 kg)  07/15/17 140 lb 3.2 oz (63.6 kg)   Patient is in no distress; well developed, nourished and groomed; neck is supple  CARDIOVASCULAR: Examination of carotid arteries is normal; no carotid bruits Regular rate and rhythm, no murmurs Examination of peripheral vascular system by observation and palpation is normal  EYES: Ophthalmoscopic exam of optic discs and posterior segments is normal; no papilledema or hemorrhages No results found.  MUSCULOSKELETAL: Gait, strength, tone, movements noted in Neurologic exam below  NEUROLOGIC: MENTAL STATUS:     09/18/2022    4:01 PM  MMSE - Mini Mental State Exam  Orientation to time 4  Orientation to Place 5  Registration 3  Attention/ Calculation 4  Recall 1  Language- name 2 objects 2  Language- repeat 1  Language- follow 3 step command 3  Language- read & follow direction 1  Write a sentence 1  Copy design 0  Total score 25   awake, alert, oriented to person, place and time recent and remote memory intact normal attention and concentration language fluent, comprehension intact, naming intact fund of knowledge appropriate  CRANIAL  NERVE:  2nd - no papilledema on fundoscopic exam 2nd, 3rd, 4th, 6th - pupils equal and reactive to light, visual fields full to confrontation, extraocular muscles intact, no nystagmus 5th - facial sensation symmetric 7th - facial strength symmetric 8th - hearing intact 9th - palate elevates symmetrically, uvula midline 11th - shoulder shrug symmetric 12th - tongue protrusion midline  MOTOR:  normal bulk and tone, full strength in the BUE, BLE  SENSORY:  normal and symmetric to light touch, temperature, vibration  COORDINATION:  finger-nose-finger, fine finger movements normal  REFLEXES:  deep tendon reflexes TRACE and symmetric  GAIT/STATION:  narrow based gait     DIAGNOSTIC DATA (LABS,  IMAGING, TESTING) - I reviewed patient records, labs, notes, testing and imaging myself where available.  Lab Results  Component Value Date   WBC 7.8 06/15/2019   HGB 15.3 (H) 06/15/2019   HCT 47.0 (H) 06/15/2019   MCV 88.8 06/15/2019   PLT 252 06/15/2019      Component Value Date/Time   NA 137 06/15/2019 1443   NA 139 09/08/2013 1448   K 4.0 06/15/2019 1443   K 3.7 09/08/2013 1448   CL 102 06/15/2019 1443   CO2 21 (L) 06/15/2019 1443   CO2 25 09/08/2013 1448   GLUCOSE 91 06/15/2019 1443   GLUCOSE 87 09/08/2013 1448   BUN 6 06/15/2019 1443   BUN 10.5 09/08/2013 1448   CREATININE 0.63 06/15/2019 1443   CREATININE 0.78 01/11/2015 1335   CREATININE 0.8 09/08/2013 1448   CALCIUM 9.2 06/15/2019 1443   CALCIUM 9.6 09/08/2013 1448   PROT 6.9 06/15/2019 1443   PROT 6.9 09/08/2013 1448   ALBUMIN 3.5 06/15/2019 1443   ALBUMIN 4.2 09/08/2013 1448   AST 38 06/15/2019 1443   AST 20 09/08/2013 1448   ALT 57 (H) 06/15/2019 1443   ALT 20 09/08/2013 1448   ALKPHOS 45 06/15/2019 1443   ALKPHOS 53 09/08/2013 1448   BILITOT 0.6 06/15/2019 1443   BILITOT 0.28 09/08/2013 1448   GFRNONAA >60 06/15/2019 1443   GFRAA >60 06/15/2019 1443   No results found for: "CHOL", "HDL", "LDLCALC",  "LDLDIRECT", "TRIG", "CHOLHDL" Lab Results  Component Value Date   HGBA1C 4.8 12/22/2012   No results found for: "VITAMINB12" Lab Results  Component Value Date   TSH 1.500 07/15/2017    06/01/22 CT head  1.  No CT evidence for intracranial pathology.    ASSESSMENT AND PLAN  57 y.o. year old female here with:  Dx:  1. Spell of abnormal behavior     PLAN:  INTERMITTENT ABNORMAL SPELLS / TRANSIENT APHASIA (ddx: TIA, metabolic, autoimmune, stress reactions) - check MRI brain, EEG - optimize sleep, stress, nutrition, exercise  Orders Placed This Encounter  Procedures   MR BRAIN W WO CONTRAST   EEG adult   Return for pending if symptoms worsen or fail to improve, pending test results.    Penni Bombard, MD [id number], [id number] PM Certified in Neurology, Neurophysiology and Neuroimaging  [facility] [address], [place], [place] [address] [phone]

## 2022-10-08 ENCOUNTER — Telehealth: Payer: Self-pay | Admitting: Diagnostic Neuroimaging

## 2022-10-08 ENCOUNTER — Ambulatory Visit (INDEPENDENT_AMBULATORY_CARE_PROVIDER_SITE_OTHER): Payer: BC Managed Care – PPO | Admitting: Diagnostic Neuroimaging

## 2022-10-08 DIAGNOSIS — R4701 Aphasia: Secondary | ICD-10-CM | POA: Diagnosis not present

## 2022-10-08 DIAGNOSIS — R4689 Other symptoms and signs involving appearance and behavior: Secondary | ICD-10-CM

## 2022-10-08 NOTE — Telephone Encounter (Signed)
MR brain w/wo scheduled at [facility] for 10/15/22 at 3:30 pm.  Berkley Harvey: [id number] (09/23/22-11/21/22)

## 2022-10-15 ENCOUNTER — Ambulatory Visit (INDEPENDENT_AMBULATORY_CARE_PROVIDER_SITE_OTHER): Payer: BC Managed Care – PPO

## 2022-10-15 DIAGNOSIS — R4689 Other symptoms and signs involving appearance and behavior: Secondary | ICD-10-CM

## 2022-10-15 MED ORDER — GADOBENATE DIMEGLUMINE 529 MG/ML IV SOLN
13.0000 mL | Freq: Once | INTRAVENOUS | Status: AC | PRN
  Administered 2022-10-15: 13 mL via INTRAVENOUS

## 2022-10-15 NOTE — Procedures (Signed)
   [facility]  EEG (ELECTROENCEPHALOGRAM) REPORT   STUDY DATE: 10/15/22 PATIENT NAME: Carol Martinez DOB: [birth year] MRN: [id number]  ORDERING CLINICIAN: Joycelyn Schmid, MD   TECHNOLOGIST: Marcheta Grammes TECHNIQUE: Electroencephalogram was recorded utilizing standard 10-20 system of lead placement and reformatted into average and bipolar montages.  RECORDING TIME: 25 minutes ACTIVATION: hyperventilation and photic stimulation  CLINICAL INFORMATION: 57 year old female with transient aphasia / confusion.   FINDINGS: Posterior dominant background rhythms, which attenuate with eye opening, ranging 11-12 hertz and 20-30 microvolts. No focal, lateralizing, epileptiform activity or seizures are seen. Patient recorded in the awake and drowsy state. EKG channel shows regular rhythm of 60-65 beats per minute.   IMPRESSION:   Normal EEG in the awake and drowsy states.    INTERPRETING PHYSICIAN:  Suanne Marker, MD Certified in Neurology, Neurophysiology and Neuroimaging  [facility] [address], [address] [place], [place] [address] [phone]

## 2023-02-22 DIAGNOSIS — R112 Nausea with vomiting, unspecified: Secondary | ICD-10-CM | POA: Diagnosis not present

## 2023-02-22 DIAGNOSIS — W57XXXA Bitten or stung by nonvenomous insect and other nonvenomous arthropods, initial encounter: Secondary | ICD-10-CM | POA: Diagnosis not present

## 2023-02-22 DIAGNOSIS — R21 Rash and other nonspecific skin eruption: Secondary | ICD-10-CM | POA: Diagnosis not present

## 2023-02-22 DIAGNOSIS — R109 Unspecified abdominal pain: Secondary | ICD-10-CM | POA: Diagnosis not present

## 2023-02-22 DIAGNOSIS — S80869A Insect bite (nonvenomous), unspecified lower leg, initial encounter: Secondary | ICD-10-CM | POA: Diagnosis not present

## 2023-05-26 DIAGNOSIS — R319 Hematuria, unspecified: Secondary | ICD-10-CM | POA: Diagnosis not present

## 2023-05-26 DIAGNOSIS — Z1231 Encounter for screening mammogram for malignant neoplasm of breast: Secondary | ICD-10-CM | POA: Diagnosis not present

## 2023-05-26 DIAGNOSIS — Z6827 Body mass index (BMI) 27.0-27.9, adult: Secondary | ICD-10-CM | POA: Diagnosis not present

## 2023-05-26 DIAGNOSIS — Z01419 Encounter for gynecological examination (general) (routine) without abnormal findings: Secondary | ICD-10-CM | POA: Diagnosis not present

## 2023-05-26 DIAGNOSIS — Z124 Encounter for screening for malignant neoplasm of cervix: Secondary | ICD-10-CM | POA: Diagnosis not present

## 2023-06-10 DIAGNOSIS — N950 Postmenopausal bleeding: Secondary | ICD-10-CM | POA: Diagnosis not present

## 2023-06-10 DIAGNOSIS — R102 Pelvic and perineal pain: Secondary | ICD-10-CM | POA: Diagnosis not present

## 2023-07-22 ENCOUNTER — Other Ambulatory Visit: Payer: Self-pay | Admitting: Obstetrics and Gynecology

## 2023-07-23 LAB — SURGICAL PATHOLOGY

## 2024-01-12 ENCOUNTER — Ambulatory Visit
Admission: EM | Admit: 2024-01-12 | Discharge: 2024-01-12 | Disposition: A | Discharge status: Home / Self Care | Attending: Family Medicine | Admitting: Family Medicine

## 2024-01-12 ENCOUNTER — Ambulatory Visit (INDEPENDENT_AMBULATORY_CARE_PROVIDER_SITE_OTHER)

## 2024-01-12 ENCOUNTER — Ambulatory Visit: Payer: Self-pay | Admitting: Urgent Care

## 2024-01-12 DIAGNOSIS — J40 Bronchitis, not specified as acute or chronic: Secondary | ICD-10-CM

## 2024-01-12 DIAGNOSIS — J329 Chronic sinusitis, unspecified: Secondary | ICD-10-CM

## 2024-01-12 MED ORDER — BENZONATATE 100 MG PO CAPS: 100.0000 mg | ORAL_CAPSULE | Freq: Three times a day (TID) | ORAL | 0 refills | Status: AC | PRN

## 2024-01-12 MED ORDER — PREDNISONE 10 MG PO TABS: 40.0000 mg | ORAL_TABLET | Freq: Every day | ORAL | 0 refills | Status: AC

## 2024-01-12 MED ORDER — CETIRIZINE HCL 10 MG PO TABS: 10.0000 mg | ORAL_TABLET | Freq: Every day | ORAL | 0 refills | Status: AC

## 2024-01-12 NOTE — ED Triage Notes (Signed)
 Pt reports cough, congestion, chest pain when coughs, sore throat when coughs x 3 days. Pt has not taken any meds for complaints.

## 2024-01-12 NOTE — ED Provider Notes (Signed)
 [facility]  Note:  This document was prepared using Conservation officer, historic buildings and may include unintentional dictation errors.  MRN: [id number] DOB: 12/30/1965  Subjective:   Carol Martinez is a 58 y.o. female presenting for 3 day history of sinus congestion, productive cough that elicits chest pain, chest congestion, hot and cold chills, throat pain. No asthma. No smoker. Quit smoking ~25 years ago. Declines COVID testing.   No current facility-administered medications for this encounter.  Current Outpatient Medications:    Collagen-Vitamin C-Biotin (COLLAGEN 1500/C) 500-50-0.8 MG CAPS, as directed Orally, Disp: , Rfl:    cholecalciferol (VITAMIN D3) 25 MCG (1000 UNIT) tablet, Take 1,000 Units by mouth daily., Disp: , Rfl:    estradiol (ESTRACE) 2 MG tablet, Take 2 mg by mouth daily., Disp: , Rfl:    nitrofurantoin, macrocrystal-monohydrate, (MACROBID) 100 MG capsule, 1 capsule, Disp: , Rfl:    progesterone (PROMETRIUM) 100 MG capsule, Take 100 mg by mouth daily., Disp: , Rfl:    Specialty Vitamins Products (COLLAGEN ULTRA) CAPS, Take 1 each by mouth daily., Disp: , Rfl:    WEGOVY 0.25 MG/0.5ML SOAJ, SMARTSIG:0.5 Milliliter(s) SUB-Q Once a Week, Disp: , Rfl:    Allergies  Allergen Reactions   Metoclopramide  Other (See Comments) and Shortness Of Breath   Phenergan [Promethazine Hcl] Anaphylaxis   Aspirin Hives and Itching   Penicillamine Other (See Comments)   Penicillins Hives and Other (See Comments)    Has patient had a PCN reaction causing immediate rash, facial/tongue/throat swelling, SOB or lightheadedness with hypotension: Yes  Has patient had a PCN reaction causing severe rash involving mucus membranes or skin necrosis: No  Has patient had a PCN reaction that required hospitalization: Unknown  Has patient had a PCN reaction occurring within the last 10 years: No  If all of the above answers are NO, then may proceed with Cephalosporin  use.   Latex Rash    Past Medical History:  Diagnosis Date   Allergy    Anxiety    Bruising 09/08/2013   Cataract    Depression    GERD (gastroesophageal reflux disease)    Heart murmur    Post-partum depression      Past Surgical History:  Procedure Laterality Date   APPENDECTOMY     EYE SURGERY     TONSILLECTOMY      Family History  Problem Relation Age of Onset   Alzheimer's disease Father    Cancer Maternal Grandmother        Blood problem   Alzheimer's disease Paternal Grandmother     Social History   Tobacco Use   Smoking status: Former    Current packs/day: 0.00    Average packs/day: 1 pack/day for 27.8 years (27.8 ttl pk-yrs)    Types: Cigarettes    Start date: 06/30/1980    Quit date: 04/08/2008    Years since quitting: 15.7   Smokeless tobacco: Never  Vaping Use   Vaping status: Never Used  Substance Use Topics   Alcohol use: Yes    Alcohol/week: 7.0 standard drinks of alcohol    Types: 7 Glasses of wine per week    Comment: Glass wine daily   Drug use: No    ROS   Objective:   Vitals: BP (!) (P) 146/82 (BP Location: Right Arm)   Pulse (P) 78   Temp 98.4 F (36.9 C) (Oral)   Resp (P) 18   LMP 10/30/2014   SpO2 (P) 97%  Physical Exam Constitutional:      General: She is not in acute distress.    Appearance: Normal appearance. She is well-developed and normal weight. She is not ill-appearing, toxic-appearing or diaphoretic.  HENT:     Head: Normocephalic and atraumatic.     Right Ear: Tympanic membrane, ear canal and external ear normal. No drainage or tenderness. No middle ear effusion. There is no impacted cerumen. Tympanic membrane is not erythematous or bulging.     Left Ear: Tympanic membrane, ear canal and external ear normal. No drainage or tenderness.  No middle ear effusion. There is no impacted cerumen. Tympanic membrane is not erythematous or bulging.     Nose: Congestion present. No rhinorrhea.     Mouth/Throat:     Mouth:  Mucous membranes are moist. No oral lesions.     Pharynx: No pharyngeal swelling, oropharyngeal exudate, posterior oropharyngeal erythema or uvula swelling.     Tonsils: No tonsillar exudate or tonsillar abscesses.     Comments: Thick streaks of postnasal drainage overlying pharynx. Eyes:     General: No scleral icterus.       Right eye: No discharge.        Left eye: No discharge.     Extraocular Movements: Extraocular movements intact.     Right eye: Normal extraocular motion.     Left eye: Normal extraocular motion.     Conjunctiva/sclera: Conjunctivae normal.  Cardiovascular:     Rate and Rhythm: Normal rate and regular rhythm.     Heart sounds: Normal heart sounds. No murmur heard.    No friction rub. No gallop.  Pulmonary:     Effort: Pulmonary effort is normal. No respiratory distress.     Breath sounds: No stridor. No wheezing, rhonchi or rales.  Chest:     Chest wall: No tenderness.  Musculoskeletal:     Cervical back: Normal range of motion and neck supple.  Lymphadenopathy:     Cervical: No cervical adenopathy.  Skin:    General: Skin is warm and dry.  Neurological:     General: No focal deficit present.     Mental Status: She is alert and oriented to person, place, and time.  Psychiatric:        Mood and Affect: Mood normal.        Behavior: Behavior normal.        Thought Content: Thought content normal.        Judgment: Judgment normal.     Assessment and Plan :   PDMP not reviewed this encounter.  1. Sinobronchitis    Will manage for viral sinobronchitis.  Patient is experiencing significant respiratory symptoms and therefore offered prednisone .  Otherwise, use supportive care.  X-ray over-read was pending at time of discharge, recommended follow up with only abnormal results. Otherwise will not call for negative over-read. Patient was in agreement.  Counseled patient on potential for adverse effects with medications prescribed/recommended today, ER and  return-to-clinic precautions discussed, patient verbalized understanding.    Christopher Savannah, [place] 01/12/24 [id number]

## 2024-01-12 NOTE — Discharge Instructions (Signed)
 I am managing you for a virus infection called sinobronchitis. We will be using prednisone  to help with your significant cough, chest pain, sinus/chest congestion. Use Zyrtec  and cough capsules as needed. Will update you later with your chest x-ray results.

## 2024-04-18 ENCOUNTER — Ambulatory Visit: Admitting: Podiatry

## 2024-04-18 ENCOUNTER — Ambulatory Visit

## 2024-04-18 ENCOUNTER — Encounter: Payer: Self-pay | Admitting: Podiatry

## 2024-04-18 DIAGNOSIS — M7751 Other enthesopathy of right foot: Secondary | ICD-10-CM | POA: Diagnosis not present

## 2024-04-18 DIAGNOSIS — R609 Edema, unspecified: Secondary | ICD-10-CM | POA: Diagnosis not present

## 2024-04-18 MED ORDER — HYDROCODONE-ACETAMINOPHEN 10-325 MG PO TABS: 1.0000 | ORAL_TABLET | Freq: Three times a day (TID) | ORAL | 0 refills | Status: AC | PRN

## 2024-04-20 NOTE — Progress Notes (Signed)
 Subjective:   Patient ID: Carol Martinez, female   DOB: 58 y.o.   MRN: [id number]   HPI patient presents stating she is having a lot of pain in her left foot and that she injured it today.  Patient states it swollen and hurting on the outside and she is not able to currently put pressure     ROS      Objective:  Physical Exam  Neurovascular status intact with quite a bit of inflammation around the lateral malleolus left and distal with inability to walk well on this and patient currently wearing a air fracture boot that she had with her.  States it is after turning her ankle today     Assessment:  Probability for a significant ankle sprain left acute     Plan:  Multiple views taken foot and ankle I did not see a fracture or I did not see diastases injury.  At this point she will follow the RICE method with compression and elevation with Ace wrap dispensed to patient and to try to keep it up is much as possible.  Reappoint 3 weeks or earlier if symptoms get worse  X-rays were negative at this time for fracture appears to be a soft tissue ankle injury but will keep it immobilized

## 2024-05-09 ENCOUNTER — Ambulatory Visit: Admitting: Podiatry

## 2024-05-09 ENCOUNTER — Encounter: Payer: Self-pay | Admitting: Podiatry

## 2024-05-09 VITALS — Ht 62.0 in | Wt 143.0 lb

## 2024-05-09 DIAGNOSIS — M7751 Other enthesopathy of right foot: Secondary | ICD-10-CM | POA: Diagnosis not present

## 2024-05-09 DIAGNOSIS — R609 Edema, unspecified: Secondary | ICD-10-CM

## 2024-05-09 MED ORDER — DICLOFENAC SODIUM 75 MG PO TBEC: 75.0000 mg | DELAYED_RELEASE_TABLET | Freq: Two times a day (BID) | ORAL | 2 refills | Status: AC

## 2024-05-11 NOTE — Progress Notes (Signed)
 Subjective:   Patient ID: Carol Martinez, female   DOB: 58 y.o.   MRN: [id number]   HPI Patient states she is improving but still having discomfort in her left ankle and swelling if she does too much.  Still wearing the boot   ROS      Objective:  Physical Exam  Neurovascular status intact negative Toula' sign noted range of motion of the ankle is somewhat reduced but it appears she is splinting mild discomfort lateral but significant reduction edema     Assessment:  Doing much better with ankle sprain left but still having some swelling and pain     Plan:  H&P reviewed that that is normal to still have discomfort to a moderate nature it should continue to improve I advised patient on different exercises to do continued elevation compression and immobilization as needed but to start reducing that I offered her a brace she states she has 1 and she will be seen back as symptoms indicate.  Placed on oral anti-inflammatory diclofenac  with all instructions on usage today

## 2024-08-01 ENCOUNTER — Encounter: Payer: Self-pay | Admitting: Gastroenterology
